# Patient Record
Sex: Female | Born: 1960 | Race: White | Hispanic: No | Smoking: Former smoker
Health system: Southern US, Community
[De-identification: ages and names within clinical notes are randomized; demographics above are authoritative.]

## PROBLEM LIST (undated history)

## (undated) DIAGNOSIS — L409 Psoriasis, unspecified: Secondary | ICD-10-CM

## (undated) DIAGNOSIS — G5601 Carpal tunnel syndrome, right upper limb: Secondary | ICD-10-CM

## (undated) DIAGNOSIS — F419 Anxiety disorder, unspecified: Secondary | ICD-10-CM

## (undated) DIAGNOSIS — M1990 Unspecified osteoarthritis, unspecified site: Secondary | ICD-10-CM

## (undated) HISTORY — PX: BREAST CYST ASPIRATION: SHX578

## (undated) HISTORY — DX: Psoriasis, unspecified: L40.9

## (undated) HISTORY — PX: TUBAL LIGATION: SHX77

## (undated) HISTORY — DX: Anxiety disorder, unspecified: F41.9

---

## 2005-07-14 ENCOUNTER — Ambulatory Visit: Payer: Self-pay

## 2006-08-02 ENCOUNTER — Ambulatory Visit: Payer: Self-pay

## 2007-08-15 ENCOUNTER — Ambulatory Visit: Payer: Self-pay

## 2008-08-16 ENCOUNTER — Ambulatory Visit: Payer: Self-pay

## 2009-08-20 ENCOUNTER — Ambulatory Visit: Payer: Self-pay

## 2010-06-05 HISTORY — PX: COLONOSCOPY: SHX174

## 2010-06-18 ENCOUNTER — Ambulatory Visit: Payer: Self-pay | Admitting: Gastroenterology

## 2010-08-26 ENCOUNTER — Ambulatory Visit: Payer: Self-pay

## 2011-09-01 ENCOUNTER — Ambulatory Visit: Payer: Self-pay

## 2016-05-31 ENCOUNTER — Other Ambulatory Visit: Payer: Self-pay | Admitting: Obstetrics and Gynecology

## 2016-05-31 DIAGNOSIS — Z1231 Encounter for screening mammogram for malignant neoplasm of breast: Secondary | ICD-10-CM

## 2016-07-12 DIAGNOSIS — Z01419 Encounter for gynecological examination (general) (routine) without abnormal findings: Secondary | ICD-10-CM | POA: Diagnosis not present

## 2016-07-23 ENCOUNTER — Other Ambulatory Visit: Payer: Self-pay | Admitting: Obstetrics and Gynecology

## 2016-07-23 ENCOUNTER — Ambulatory Visit
Admission: RE | Admit: 2016-07-23 | Discharge: 2016-07-23 | Disposition: A | Discharge status: Home / Self Care | Payer: BLUE CROSS/BLUE SHIELD | Source: Ambulatory Visit | Attending: Obstetrics and Gynecology | Admitting: Obstetrics and Gynecology

## 2016-07-23 DIAGNOSIS — Z1231 Encounter for screening mammogram for malignant neoplasm of breast: Secondary | ICD-10-CM

## 2016-11-23 DIAGNOSIS — B309 Viral conjunctivitis, unspecified: Secondary | ICD-10-CM | POA: Diagnosis not present

## 2016-12-17 DIAGNOSIS — Z0100 Encounter for examination of eyes and vision without abnormal findings: Secondary | ICD-10-CM | POA: Diagnosis not present

## 2017-06-13 ENCOUNTER — Other Ambulatory Visit: Payer: Self-pay | Admitting: Obstetrics and Gynecology

## 2017-06-13 DIAGNOSIS — Z1231 Encounter for screening mammogram for malignant neoplasm of breast: Secondary | ICD-10-CM

## 2017-07-19 NOTE — Progress Notes (Signed)
Gynecology Annual Exam  PCP: Patient, No Pcp Per  Chief Complaint:  Chief Complaint  Patient presents with  . Gynecologic Exam    Refill on Xanax    History of Present Illness:Patient is a 56 y.o. No obstetric history on file. presents for annual exam. The patient has no complaints today.   LMP: No LMP recorded. Patient is postmenopausal. No bleeding or spotting  The patient is not sexually active. She denies dyspareunia.  The patient does perform self breast exams.  There is notable family history of breast or ovarian cancer in her family maternal grandmother.  The patient wears seatbelts: yes.   The patient has regular exercise: not asked.    The patient denies current symptoms of depression.     Review of Systems: Review of Systems  Constitutional: Negative for chills and fever.  HENT: Negative for congestion.   Respiratory: Negative for cough and shortness of breath.   Cardiovascular: Negative for chest pain and palpitations.  Gastrointestinal: Negative for abdominal pain, constipation, diarrhea, heartburn, nausea and vomiting.  Genitourinary: Negative for dysuria, frequency and urgency.  Skin: Negative for itching and rash.  Neurological: Negative for dizziness and headaches.  Endo/Heme/Allergies: Negative for polydipsia.  Psychiatric/Behavioral: Negative for depression.    Past Medical History:  History reviewed. No pertinent past medical history.  Past Surgical History:  Past Surgical History:  Procedure Laterality Date  . BREAST CYST ASPIRATION      Gynecologic History:  No LMP recorded. Patient is postmenopausal. Last Pap: Results were: 07/12/16 NIL and HR HPV negative  Last mammogram: 07/13/16 Results were: BI-RAD I   Obstetric History: No obstetric history on file.  Family History:  Family History  Problem Relation Age of Onset  . Breast cancer Maternal Grandmother     Social History:  Social History   Social History  . Marital status:  Married    Spouse name: N/A  . Number of children: N/A  . Years of education: N/A   Occupational History  . Not on file.   Social History Main Topics  . Smoking status: Light Tobacco Smoker  . Smokeless tobacco: Never Used  . Alcohol use Yes     Comment: Occ  . Drug use: No  . Sexual activity: No   Other Topics Concern  . Not on file   Social History Narrative  . No narrative on file    Allergies:  No Known Allergies  Medications: Prior to Admission medications   Not on File    Physical Exam Vitals: There were no vitals taken for this visit.  Physical Exam  Constitutional: She is oriented to person, place, and time. She appears well-developed and well-nourished. No distress.  Genitourinary: Vagina normal and uterus normal. Pelvic exam was performed with patient supine. There is no rash, lesion or Bartholin's cyst on the right labia. There is no rash, lesion or Bartholin's cyst on the left labia. Vagina exhibits no lesion. No bleeding in the vagina. No vaginal discharge found. Right adnexum does not display mass and does not display tenderness. Left adnexum does not display mass and does not display tenderness. Cervix does not exhibit motion tenderness, lesion, discharge, friability or polyp.   Uterus is anteverted. Uterus is not enlarged, tender, exhibiting a mass, irregular (is regular) or mobile.  HENT:  Head: Normocephalic and atraumatic.  Right Ear: External ear normal.  Left Ear: External ear normal.  Nose: Nose normal.  Eyes: Conjunctivae are normal. No scleral icterus.  Neck:  Neck supple. No thyromegaly present.  Cardiovascular: Normal rate, regular rhythm, normal heart sounds and intact distal pulses.   Pulmonary/Chest: Effort normal and breath sounds normal.  Abdominal: Soft. Bowel sounds are normal. She exhibits no distension and no mass. There is no tenderness. No hernia.  Musculoskeletal: Normal range of motion.  Lymphadenopathy:       Right: No inguinal  adenopathy present.       Left: No inguinal adenopathy present.  Neurological: She is alert and oriented to person, place, and time. No cranial nerve deficit.  Skin: Skin is warm. No rash noted.  Psychiatric: She has a normal mood and affect. Her behavior is normal. Judgment and thought content normal.  Vitals reviewed.   Female chaperone present for pelvic and breast  portions of the physical exam     Assessment: 56 y.o. . routine annual exam  Plan: Problem List Items Addressed This Visit    None    Visit Diagnoses    Screening for malignant neoplasm of cervix       Relevant Orders   PapIG, HPV, rfx 16/18   Breast screening       Relevant Orders   MM DIGITAL SCREENING BILATERAL   Encounter for gynecological examination without abnormal finding       Relevant Orders   PapIG, HPV, rfx 16/18      1) Mammogram - recommend yearly screening mammogram.  Mammogram Was ordered today  2) STI screening was not offered  3) ASCCP guidelines and rational discussed.  Patient opts for yearly screening interval  4) Osteoporosis  - per USPTF routine screening DEXA at age 765  5) Routine healthcare maintenance including cholesterol, diabetes screening discussed managed by PCP - obtains yearly health screening at work  6) Colonoscopy - Screening recommended starting at age 57 for average risk individuals, age 57 for individuals deemed at increased risk (including African Americans) and recommended to continue until age 575.  For patient age 57-85 individualized approach is recommended.  Gold standard screening is via colonoscopy, Cologuard screening is an acceptable alternative for patient unwilling or unable to undergo colonoscopy.  "Colorectal cancer screening for average?risk adults: 2018 guideline update from the American Cancer Society"CA: A Cancer Journal for Clinicians: May 04, 2017  - up to date next age 57  7) Follow up 1 year for routine annual

## 2017-07-20 ENCOUNTER — Encounter: Payer: Self-pay | Admitting: Obstetrics and Gynecology

## 2017-07-20 ENCOUNTER — Ambulatory Visit (INDEPENDENT_AMBULATORY_CARE_PROVIDER_SITE_OTHER): Payer: BLUE CROSS/BLUE SHIELD | Admitting: Obstetrics and Gynecology

## 2017-07-20 ENCOUNTER — Telehealth: Payer: Self-pay

## 2017-07-20 DIAGNOSIS — Z1239 Encounter for other screening for malignant neoplasm of breast: Secondary | ICD-10-CM

## 2017-07-20 DIAGNOSIS — Z1231 Encounter for screening mammogram for malignant neoplasm of breast: Secondary | ICD-10-CM

## 2017-07-20 DIAGNOSIS — Z124 Encounter for screening for malignant neoplasm of cervix: Secondary | ICD-10-CM

## 2017-07-20 DIAGNOSIS — Z01419 Encounter for gynecological examination (general) (routine) without abnormal findings: Secondary | ICD-10-CM

## 2017-07-20 NOTE — Telephone Encounter (Signed)
Please advise 

## 2017-07-20 NOTE — Patient Instructions (Signed)
Preventive Care 40-64 Years, Female Preventive care refers to lifestyle choices and visits with your health care provider that can promote health and wellness. What does preventive care include?  A yearly physical exam. This is also called an annual well check.  Dental exams once or twice a year.  Routine eye exams. Ask your health care provider how often you should have your eyes checked.  Personal lifestyle choices, including: ? Daily care of your teeth and gums. ? Regular physical activity. ? Eating a healthy diet. ? Avoiding tobacco and drug use. ? Limiting alcohol use. ? Practicing safe sex. ? Taking low-dose aspirin daily starting at age 58. ? Taking vitamin and mineral supplements as recommended by your health care provider. What happens during an annual well check? The services and screenings done by your health care provider during your annual well check will depend on your age, overall health, lifestyle risk factors, and family history of disease. Counseling Your health care provider may ask you questions about your:  Alcohol use.  Tobacco use.  Drug use.  Emotional well-being.  Home and relationship well-being.  Sexual activity.  Eating habits.  Work and work Statistician.  Method of birth control.  Menstrual cycle.  Pregnancy history.  Screening You may have the following tests or measurements:  Height, weight, and BMI.  Blood pressure.  Lipid and cholesterol levels. These may be checked every 5 years, or more frequently if you are over 81 years old.  Skin check.  Lung cancer screening. You may have this screening every year starting at age 78 if you have a 30-pack-year history of smoking and currently smoke or have quit within the past 15 years.  Fecal occult blood test (FOBT) of the stool. You may have this test every year starting at age 65.  Flexible sigmoidoscopy or colonoscopy. You may have a sigmoidoscopy every 5 years or a colonoscopy  every 10 years starting at age 30.  Hepatitis C blood test.  Hepatitis B blood test.  Sexually transmitted disease (STD) testing.  Diabetes screening. This is done by checking your blood sugar (glucose) after you have not eaten for a while (fasting). You may have this done every 1-3 years.  Mammogram. This may be done every 1-2 years. Talk to your health care provider about when you should start having regular mammograms. This may depend on whether you have a family history of breast cancer.  BRCA-related cancer screening. This may be done if you have a family history of breast, ovarian, tubal, or peritoneal cancers.  Pelvic exam and Pap test. This may be done every 3 years starting at age 80. Starting at age 36, this may be done every 5 years if you have a Pap test in combination with an HPV test.  Bone density scan. This is done to screen for osteoporosis. You may have this scan if you are at high risk for osteoporosis.  Discuss your test results, treatment options, and if necessary, the need for more tests with your health care provider. Vaccines Your health care provider may recommend certain vaccines, such as:  Influenza vaccine. This is recommended every year.  Tetanus, diphtheria, and acellular pertussis (Tdap, Td) vaccine. You may need a Td booster every 10 years.  Varicella vaccine. You may need this if you have not been vaccinated.  Zoster vaccine. You may need this after age 5.  Measles, mumps, and rubella (MMR) vaccine. You may need at least one dose of MMR if you were born in  1958 or later. You may also need a second dose.  Pneumococcal 13-valent conjugate (PCV13) vaccine. You may need this if you have certain conditions and were not previously vaccinated.  Pneumococcal polysaccharide (PPSV23) vaccine. You may need one or two doses if you smoke cigarettes or if you have certain conditions.  Meningococcal vaccine. You may need this if you have certain  conditions.  Hepatitis A vaccine. You may need this if you have certain conditions or if you travel or work in places where you may be exposed to hepatitis A.  Hepatitis B vaccine. You may need this if you have certain conditions or if you travel or work in places where you may be exposed to hepatitis B.  Haemophilus influenzae type b (Hib) vaccine. You may need this if you have certain conditions.  Talk to your health care provider about which screenings and vaccines you need and how often you need them. This information is not intended to replace advice given to you by your health care provider. Make sure you discuss any questions you have with your health care provider. Document Released: 12/19/2015 Document Revised: 08/11/2016 Document Reviewed: 09/23/2015 Elsevier Interactive Patient Education  2017 [employer].

## 2017-07-20 NOTE — Telephone Encounter (Signed)
Pt was seen this am by AMS, forgot to ask if he will call in [facility] [address] [facility] [place].  Please let her know.  [phone]

## 2017-07-22 LAB — PAPIG, HPV, RFX 16/18
HPV, HIGH-RISK: NEGATIVE
PAP SMEAR COMMENT: 0

## 2017-07-25 ENCOUNTER — Other Ambulatory Visit: Payer: Self-pay | Admitting: Obstetrics and Gynecology

## 2017-07-25 ENCOUNTER — Encounter: Payer: Self-pay | Admitting: Obstetrics and Gynecology

## 2017-07-25 MED ORDER — ALPRAZOLAM 0.25 MG PO TABS: 0.2500 mg | ORAL_TABLET | Freq: Two times a day (BID) | ORAL | 0 refills | Status: DC | PRN

## 2017-07-27 ENCOUNTER — Ambulatory Visit
Admission: RE | Admit: 2017-07-27 | Discharge: 2017-07-27 | Disposition: A | Discharge status: Home / Self Care | Payer: BLUE CROSS/BLUE SHIELD | Source: Ambulatory Visit | Attending: Obstetrics and Gynecology | Admitting: Obstetrics and Gynecology

## 2017-07-27 DIAGNOSIS — Z1231 Encounter for screening mammogram for malignant neoplasm of breast: Secondary | ICD-10-CM | POA: Diagnosis not present

## 2017-07-29 ENCOUNTER — Encounter: Payer: Self-pay | Admitting: Obstetrics and Gynecology

## 2017-09-21 DIAGNOSIS — H01119 Allergic dermatitis of unspecified eye, unspecified eyelid: Secondary | ICD-10-CM | POA: Diagnosis not present

## 2018-01-03 ENCOUNTER — Telehealth: Payer: Self-pay

## 2018-01-03 ENCOUNTER — Other Ambulatory Visit: Payer: Self-pay | Admitting: Obstetrics and Gynecology

## 2018-01-03 NOTE — Telephone Encounter (Signed)
I received and denied request from pharmacy this morning d/t pt needs a medication follow up appointment with AMS. She has not been seen since 07/2017.    Please schedule AMS or first available. Does not have to be overbooked

## 2018-01-03 NOTE — Telephone Encounter (Signed)
Pt called to request refill for alprazolam 0.25mg . She would like a 90 days supply. Advised pt that AMS out of the office today and not sure if we could do a 90 day or not but would send msg to his nurse. Pt cb# [phone]. Thank you.

## 2018-01-05 NOTE — Telephone Encounter (Signed)
Left message for pt to cb and schedule med follow up for any refills.

## 2018-01-09 ENCOUNTER — Other Ambulatory Visit: Payer: Self-pay | Admitting: Obstetrics and Gynecology

## 2018-01-09 MED ORDER — ALPRAZOLAM 0.25 MG PO TABS: 0.2500 mg | ORAL_TABLET | Freq: Two times a day (BID) | ORAL | 0 refills | Status: DC | PRN

## 2018-01-09 NOTE — Telephone Encounter (Signed)
Called and spoke with patient about scheduling medication follow up. Pt states Dr. Bonney AidStaebler prescribes medication at yearly annual and needs prescription for 90 days refills. Please advise

## 2018-01-09 NOTE — Telephone Encounter (Signed)
Pt is notified.

## 2018-01-09 NOTE — Telephone Encounter (Signed)
Pt calling again stating according to MyChart refill of alprazolam has been denied.  Please call. [phone]

## 2018-01-09 NOTE — Telephone Encounter (Signed)
Refill sent.

## 2018-07-28 ENCOUNTER — Ambulatory Visit
Admission: RE | Admit: 2018-07-28 | Discharge: 2018-07-28 | Disposition: A | Discharge status: Home / Self Care | Payer: BLUE CROSS/BLUE SHIELD | Source: Ambulatory Visit | Attending: Obstetrics and Gynecology | Admitting: Obstetrics and Gynecology

## 2018-07-28 DIAGNOSIS — Z1231 Encounter for screening mammogram for malignant neoplasm of breast: Secondary | ICD-10-CM | POA: Diagnosis not present

## 2018-07-28 DIAGNOSIS — Z1239 Encounter for other screening for malignant neoplasm of breast: Secondary | ICD-10-CM

## 2018-09-12 ENCOUNTER — Ambulatory Visit
Admission: RE | Admit: 2018-09-12 | Discharge: 2018-09-12 | Disposition: A | Discharge status: Home / Self Care | Payer: BLUE CROSS/BLUE SHIELD | Source: Ambulatory Visit | Attending: Registered Nurse | Admitting: Registered Nurse

## 2018-09-12 ENCOUNTER — Other Ambulatory Visit: Payer: Self-pay | Admitting: Registered Nurse

## 2018-09-12 DIAGNOSIS — R2232 Localized swelling, mass and lump, left upper limb: Secondary | ICD-10-CM | POA: Insufficient documentation

## 2018-09-12 DIAGNOSIS — M25532 Pain in left wrist: Secondary | ICD-10-CM | POA: Diagnosis not present

## 2018-09-12 DIAGNOSIS — S6992XA Unspecified injury of left wrist, hand and finger(s), initial encounter: Secondary | ICD-10-CM | POA: Diagnosis not present

## 2018-09-12 DIAGNOSIS — R609 Edema, unspecified: Secondary | ICD-10-CM

## 2018-09-12 DIAGNOSIS — M7989 Other specified soft tissue disorders: Secondary | ICD-10-CM | POA: Insufficient documentation

## 2018-09-12 DIAGNOSIS — M79645 Pain in left finger(s): Secondary | ICD-10-CM | POA: Diagnosis not present

## 2018-09-12 DIAGNOSIS — R52 Pain, unspecified: Secondary | ICD-10-CM

## 2018-10-23 DIAGNOSIS — M65322 Trigger finger, left index finger: Secondary | ICD-10-CM | POA: Diagnosis not present

## 2018-10-25 DIAGNOSIS — H02889 Meibomian gland dysfunction of unspecified eye, unspecified eyelid: Secondary | ICD-10-CM | POA: Diagnosis not present

## 2018-10-25 DIAGNOSIS — H5213 Myopia, bilateral: Secondary | ICD-10-CM | POA: Diagnosis not present

## 2018-10-27 DIAGNOSIS — M65322 Trigger finger, left index finger: Secondary | ICD-10-CM | POA: Diagnosis not present

## 2019-02-15 DIAGNOSIS — D2262 Melanocytic nevi of left upper limb, including shoulder: Secondary | ICD-10-CM | POA: Diagnosis not present

## 2019-02-15 DIAGNOSIS — D2261 Melanocytic nevi of right upper limb, including shoulder: Secondary | ICD-10-CM | POA: Diagnosis not present

## 2019-02-15 DIAGNOSIS — D225 Melanocytic nevi of trunk: Secondary | ICD-10-CM | POA: Diagnosis not present

## 2019-02-15 DIAGNOSIS — L400 Psoriasis vulgaris: Secondary | ICD-10-CM | POA: Diagnosis not present

## 2019-05-31 ENCOUNTER — Telehealth: Payer: Self-pay

## 2019-05-31 NOTE — Telephone Encounter (Signed)
Jacquelin contacted me to let me know that she will contact Dr. Georgianne Fick to order her mammogram.  States she hasn't done her annual physical with our office because of COVID & she's high risk and doesn't wish to come to the office if she doesn't have to.

## 2019-05-31 NOTE — Telephone Encounter (Signed)
Virginia Galvan sent an email stating she called [facility] to schedule her annual mammogram & they told her she had to call her doctor to get them to send an order for it.   Reviewed her chart & saw that GYN physician has ordered them & we've never ordered them for her.  Spoke with Dr. Roxan Hockey who said it would be best for the GYN doc to order her mammogram for continuity of care. [name] to contact her GYN to order the mammogram, but if she's no longer seeing a GYN to contact us to discuss.

## 2019-06-11 ENCOUNTER — Other Ambulatory Visit: Payer: Self-pay

## 2019-06-12 MED ORDER — ALPRAZOLAM 0.25 MG PO TABS: ORAL_TABLET | ORAL | 0 refills | Status: DC

## 2019-06-28 ENCOUNTER — Telehealth: Payer: Self-pay

## 2019-06-28 NOTE — Telephone Encounter (Signed)
Pt calling for AMS to put order in for her annual mammogram at the [facility].  [phone]

## 2019-06-29 NOTE — Telephone Encounter (Signed)
OK to wait until you return to office. KJ CMA

## 2019-06-30 ENCOUNTER — Other Ambulatory Visit: Payer: Self-pay | Admitting: Obstetrics and Gynecology

## 2019-06-30 DIAGNOSIS — Z1231 Encounter for screening mammogram for malignant neoplasm of breast: Secondary | ICD-10-CM

## 2019-06-30 NOTE — Telephone Encounter (Signed)
Order is in.

## 2019-07-02 NOTE — Telephone Encounter (Signed)
Pt aware via voicemail 

## 2019-07-25 ENCOUNTER — Ambulatory Visit: Payer: 59

## 2019-07-26 ENCOUNTER — Other Ambulatory Visit: Payer: Self-pay

## 2019-07-26 ENCOUNTER — Ambulatory Visit: Payer: 59

## 2019-07-26 DIAGNOSIS — Z23 Encounter for immunization: Secondary | ICD-10-CM

## 2019-08-06 ENCOUNTER — Ambulatory Visit
Admission: RE | Admit: 2019-08-06 | Discharge: 2019-08-06 | Disposition: A | Discharge status: Home / Self Care | Payer: 59 | Source: Ambulatory Visit | Attending: Obstetrics and Gynecology | Admitting: Obstetrics and Gynecology

## 2019-08-06 DIAGNOSIS — Z1231 Encounter for screening mammogram for malignant neoplasm of breast: Secondary | ICD-10-CM | POA: Insufficient documentation

## 2019-08-07 ENCOUNTER — Other Ambulatory Visit: Payer: Self-pay | Admitting: Internal Medicine

## 2019-08-08 ENCOUNTER — Other Ambulatory Visit: Payer: Self-pay

## 2019-08-08 ENCOUNTER — Ambulatory Visit: Payer: Self-pay | Admitting: Internal Medicine

## 2019-08-08 ENCOUNTER — Encounter: Payer: Self-pay | Admitting: Internal Medicine

## 2019-08-08 VITALS — BP 108/63 | HR 75 | Temp 98.1°F | Resp 14 | Ht 67.0 in | Wt 157.0 lb

## 2019-08-08 DIAGNOSIS — L300 Nummular dermatitis: Secondary | ICD-10-CM | POA: Insufficient documentation

## 2019-08-08 DIAGNOSIS — F411 Generalized anxiety disorder: Secondary | ICD-10-CM

## 2019-08-08 DIAGNOSIS — F17200 Nicotine dependence, unspecified, uncomplicated: Secondary | ICD-10-CM | POA: Insufficient documentation

## 2019-08-08 DIAGNOSIS — L409 Psoriasis, unspecified: Secondary | ICD-10-CM

## 2019-08-08 MED ORDER — CALCIPOTRIENE-BETAMETH DIPROP 0.005-0.064 % EX OINT: 1.0000 "application " | TOPICAL_OINTMENT | Freq: Every day | CUTANEOUS | 1 refills | Status: DC

## 2019-08-08 MED ORDER — TRIAMCINOLONE ACETONIDE 0.1 % EX CREA
1.0000 | TOPICAL_CREAM | Freq: Two times a day (BID) | CUTANEOUS | 1 refills | Status: DC
Start: 2019-08-08 — End: 2023-07-02

## 2019-08-08 MED ORDER — ALPRAZOLAM 0.25 MG PO TABS: ORAL_TABLET | ORAL | 2 refills | Status: DC

## 2019-08-08 MED ORDER — FLUOCINOLONE ACETONIDE 0.01 % OT OIL: 5.0000 [drp] | TOPICAL_OIL | OTIC | 1 refills | Status: DC | PRN

## 2019-08-08 NOTE — Progress Notes (Signed)
S -patient is a 58 year old white female who comes requesting refills of medications.  She is on multiple topical ointments and creams for psoriasis.  1 is a drop she uses for itching in her ears.  1 was started for a rash underneath her armpit area on both sides more recently, the [facility] product.  She has had psoriasis for years, at one point did see a dermatologist in [place].  She has it now in her elbow region bilaterally, has involved her nails to some extent, and also has patches on her lower extremities.  She uses the ointment products for her psoriasis intermittently, the one not more than 4 weeks at a time as recommended.  She also requests a refill of her Xanax which she uses for anxiety.  ("Nerves").  She has been taking this medicine for 10 years now, started initially when she lost her job, and really was not using it after that time when she got another job here with a steady.  She then returned to using it daily in the more recent past, as her husband is in need of almost total care, and creates a lot of anxiety for her.  She notes that she does not take it on the weekends.  She only takes it once a day.  No Known Allergies Current Outpatient Medications on File Prior to Visit  Medication Sig Dispense Refill  . Cholecalciferol (VITAMIN D3 PO) Take by mouth.    . clobetasol ointment (TEMOVATE) 0.05 % Apply 1 application topically every morning.    . Multiple Vitamin (MULTI-VITAMIN) tablet Take by mouth.    . vitamin B-12 (CYANOCOBALAMIN) 1000 MCG tablet Take by mouth.     No current facility-administered medications on file prior to visit.    The medications she is taking also includes a TMC cream-0.1%, which she applies to the areas underneath her armpits, a fluocinolone acetonide 0.01% oil which she puts on a Q-tip and uses in her ear canals when they feel itchy, which is usually once in the morning, and then does not need for days later.  She states she does not even use this more than  once a month, a Taclonex ointment which she applies 1 application daily, and does not use this more than several weeks at a time as recommended.  She takes this in the morning, and then has the clobetasol ointment that she applies in the evening when she needs this for her psoriasis.  Tob -she does smoke cigarettes, mostly when she is having alcohol on the weekends only, denies any heavy alcohol use.  O - NAD, maskd BP 108/63 (BP Location: Right Arm, Patient Position: Sitting, Cuff Size: Large)   Pulse 75   Temp 98.1 F (36.7 C) (Oral)   Resp 14   Ht 5\' 7"  (1.702 m)   Wt 157 lb (71.2 kg)   SpO2 98%   BMI 24.59 kg/m   HEENT -sclera anicteric, no facial involvement. Skin -scattered more circular patches were present on her lower extremities, with a small cluster lateral lower extremity above the left ankle.  There was no frank plaque entities on the lower extremities.  These patches were erythematous and slightly raised and scaly.  Her elbows bilaterally on the extensor surfaces had remnants of more plaque-like psoriasis, with some erythema and scaliness present.  Beneath her axilla regions bilaterally were fading erythematous areas, no marked scaliness evident on exam today.  Several nails on the right hand had mild changes consistent with inflammation underneath  the distal nail  Her affect was not flat, she was very appropriate with conversation, speech was not rapid.  Ass - 1.  Psoriasis -a history of this noted in the paper chart which was reviewed today.  Has been managed with several topical entities, and concerned with the number of more concentrated steroids that have been utilized Refilled the Taclonex ointment to use to the none nummular eczema and psoriasis concerns.  To apply sparingly, and not more than 4 weeks. Did not refill the clobetasol product as that is a very concentrated steroid noted and discussed the risks and concerns with that with long-term use.  If felt needed,  would ask the dermatologist to help with that need. Do feel best to get a dermatology opinion again to try to help curtail her regimen some, possibly consider other entities pending her severity that may not involve topical steroids, and a referral was written today and await their input.  She prefers to see the dermatologist she has seen in the past and I think that is reasonable.  2. Nummular eczema   - LE's  I do believe the areas on the lower extremity are consistent with nummular eczema, and discussed this with her today.  Okay to use the Taclonex ointment product to this area as well on the lower extremities as await Derm input.  Noted the eczema to psoriasis spectrum in the education today, as well as a lot of the newer entities out there to potentially treat psoriasis.  3. Anxiety  I did refill the Xanax-0.25 mg to use daily as needed, and discussed at length concerns with this medicine used on any regular basis.  It has been quite helpful for her with a lot of the stressors at home presently, caring for her husband, and will continue to use daily as needed, continuing to have weekends off as she is doing, and if she can have days away from it even during the week, encouraged that as well.  $. Tobacco use - rec'ed complete cessation as best  Await dermatology's input presently

## 2020-01-07 DIAGNOSIS — D2261 Melanocytic nevi of right upper limb, including shoulder: Secondary | ICD-10-CM | POA: Diagnosis not present

## 2020-01-07 DIAGNOSIS — L308 Other specified dermatitis: Secondary | ICD-10-CM | POA: Diagnosis not present

## 2020-01-07 DIAGNOSIS — L309 Dermatitis, unspecified: Secondary | ICD-10-CM | POA: Diagnosis not present

## 2020-01-07 DIAGNOSIS — D2272 Melanocytic nevi of left lower limb, including hip: Secondary | ICD-10-CM | POA: Diagnosis not present

## 2020-01-07 DIAGNOSIS — L400 Psoriasis vulgaris: Secondary | ICD-10-CM | POA: Diagnosis not present

## 2020-01-07 DIAGNOSIS — D2262 Melanocytic nevi of left upper limb, including shoulder: Secondary | ICD-10-CM | POA: Diagnosis not present

## 2020-03-14 DIAGNOSIS — H5213 Myopia, bilateral: Secondary | ICD-10-CM | POA: Diagnosis not present

## 2020-04-03 NOTE — Progress Notes (Signed)
Scheduled to complete physical 04/09/20 with Bridget Hartshorn, PA-C.  AMD

## 2020-04-04 ENCOUNTER — Ambulatory Visit: Payer: Self-pay

## 2020-04-04 ENCOUNTER — Other Ambulatory Visit: Payer: Self-pay

## 2020-04-04 DIAGNOSIS — Z0000 Encounter for general adult medical examination without abnormal findings: Secondary | ICD-10-CM

## 2020-04-04 LAB — POCT URINALYSIS DIPSTICK
Bilirubin, UA: NEGATIVE
Blood, UA: NEGATIVE
Glucose, UA: NEGATIVE
Ketones, UA: NEGATIVE
Nitrite, UA: NEGATIVE
Protein, UA: NEGATIVE
Spec Grav, UA: 1.025 (ref 1.010–1.025)
Urobilinogen, UA: 0.2 E.U./dL
pH, UA: 5.5 (ref 5.0–8.0)

## 2020-04-05 LAB — CMP12+LP+TP+TSH+6AC+CBC/D/PLT
ALT: 22 IU/L (ref 0–32)
AST: 25 IU/L (ref 0–40)
Albumin/Globulin Ratio: 2.1 (ref 1.2–2.2)
Albumin: 4.6 g/dL (ref 3.8–4.9)
Alkaline Phosphatase: 96 IU/L (ref 39–117)
BUN/Creatinine Ratio: 18 (ref 9–23)
BUN: 17 mg/dL (ref 6–24)
Basophils Absolute: 0.1 10*3/uL (ref 0.0–0.2)
Basos: 1 %
Bilirubin Total: 0.3 mg/dL (ref 0.0–1.2)
Calcium: 9.8 mg/dL (ref 8.7–10.2)
Chloride: 106 mmol/L (ref 96–106)
Chol/HDL Ratio: 4.6 ratio — ABNORMAL HIGH (ref 0.0–4.4)
Cholesterol, Total: 210 mg/dL — ABNORMAL HIGH (ref 100–199)
Creatinine, Ser: 0.93 mg/dL (ref 0.57–1.00)
EOS (ABSOLUTE): 0.1 10*3/uL (ref 0.0–0.4)
Eos: 2 %
Estimated CHD Risk: 1.1 times avg. — ABNORMAL HIGH (ref 0.0–1.0)
Free Thyroxine Index: 1.7 (ref 1.2–4.9)
GFR calc Af Amer: 78 mL/min/{1.73_m2} (ref >59)
GFR calc non Af Amer: 67 mL/min/{1.73_m2} (ref >59)
GGT: 18 IU/L (ref 0–60)
Globulin, Total: 2.2 g/dL (ref 1.5–4.5)
Glucose: 92 mg/dL (ref 65–99)
HDL: 46 mg/dL (ref >39)
Hematocrit: 39.1 % (ref 34.0–46.6)
Hemoglobin: 13.3 g/dL (ref 11.1–15.9)
Immature Grans (Abs): 0 10*3/uL (ref 0.0–0.1)
Immature Granulocytes: 0 %
Iron: 88 ug/dL (ref 27–159)
LDH: 157 IU/L (ref 119–226)
LDL Chol Calc (NIH): 142 mg/dL — ABNORMAL HIGH (ref 0–99)
Lymphocytes Absolute: 1.3 10*3/uL (ref 0.7–3.1)
Lymphs: 24 %
MCH: 30.8 pg (ref 26.6–33.0)
MCHC: 34 g/dL (ref 31.5–35.7)
MCV: 91 fL (ref 79–97)
Monocytes Absolute: 0.6 10*3/uL (ref 0.1–0.9)
Monocytes: 11 %
Neutrophils Absolute: 3.3 10*3/uL (ref 1.4–7.0)
Neutrophils: 62 %
Phosphorus: 3.7 mg/dL (ref 3.0–4.3)
Platelets: 280 10*3/uL (ref 150–450)
Potassium: 4.4 mmol/L (ref 3.5–5.2)
RBC: 4.32 x10E6/uL (ref 3.77–5.28)
RDW: 12.1 % (ref 11.7–15.4)
Sodium: 141 mmol/L (ref 134–144)
T3 Uptake Ratio: 27 % (ref 24–39)
T4, Total: 6.2 ug/dL (ref 4.5–12.0)
TSH: 2.24 u[IU]/mL (ref 0.450–4.500)
Total Protein: 6.8 g/dL (ref 6.0–8.5)
Triglycerides: 120 mg/dL (ref 0–149)
Uric Acid: 5.4 mg/dL (ref 3.0–7.2)
VLDL Cholesterol Cal: 22 mg/dL (ref 5–40)
WBC: 5.4 10*3/uL (ref 3.4–10.8)

## 2020-04-05 LAB — SEDIMENTATION RATE: Sed Rate: 20 mm/hr (ref 0–40)

## 2020-04-05 LAB — RHEUMATOID FACTOR: Rhuematoid fact SerPl-aCnc: <10 IU/mL (ref 0.0–13.9)

## 2020-04-09 ENCOUNTER — Ambulatory Visit: Payer: 59 | Admitting: Emergency Medicine

## 2020-04-09 ENCOUNTER — Encounter: Payer: Self-pay | Admitting: Emergency Medicine

## 2020-04-09 ENCOUNTER — Other Ambulatory Visit: Payer: Self-pay

## 2020-04-09 VITALS — BP 105/62 | HR 62 | Temp 97.0°F | Resp 12 | Ht 67.0 in | Wt 158.0 lb

## 2020-04-09 DIAGNOSIS — L4050 Arthropathic psoriasis, unspecified: Secondary | ICD-10-CM

## 2020-04-09 DIAGNOSIS — L409 Psoriasis, unspecified: Secondary | ICD-10-CM

## 2020-04-09 DIAGNOSIS — Z0000 Encounter for general adult medical examination without abnormal findings: Secondary | ICD-10-CM

## 2020-04-09 NOTE — Progress Notes (Signed)
I have reviewed the triage vital signs and the nursing notes.   HISTORY  Chief Complaint Annual Exam   HPI Virginia Galvan is a 59 y.o. female is here for an annual physical.  Patient is also concerned about stiffness in certain joints and the fact that she has psoriasis.  She is also seeing changes in her nails that are concerning.  She states that when waking in the mornings that she is stiff.  Psoriatic arthritis is her concern.  Currently she is taking ibuprofen with some relief of her stiffness.      Past Medical History:  Diagnosis Date  . Anxiety   . Psoriasis     Patient Active Problem List   Diagnosis Date Noted  . Anxiety state 08/08/2019  . Psoriasis 08/08/2019  . Nummular eczema 08/08/2019  . Tobacco use disorder 08/08/2019    Past Surgical History:  Procedure Laterality Date  . BREAST CYST ASPIRATION Left 90s  . COLONOSCOPY  06/2010  . TUBAL LIGATION      Prior to Admission medications   Medication Sig Start Date End Date Taking? Authorizing Provider  ALPRAZolam Prudy Feeler) 0.25 MG tablet Take 1 tablet by mouth up to once daily as needed, Not take daily. 08/08/19  Yes Jamelle Haring, MD  calcipotriene-betamethasone (TACLONEX) ointment Apply 1 application topically daily. Not use on face, armpit, groin. Not use longer than 4 weeks. 08/08/19  Yes Jamelle Haring, MD  Cholecalciferol (VITAMIN D3 PO) Take by mouth.   Yes [provider]  clobetasol ointment (TEMOVATE) 0.05 % Apply 1 application topically every morning.   Yes [provider]  Fluocinolone Acetonide 0.01 % OIL Place 5 drops in ear(s) as needed. Use no more than 5 days 08/08/19  Yes Jamelle Haring, MD  Multiple Vitamin (MULTI-VITAMIN) tablet Take by mouth.   Yes [provider]  triamcinolone cream (KENALOG) 0.1 % Apply 1 application topically 2 (two) times daily. Use sparingly for areas under the armpit region 08/08/19  Yes Jamelle Haring, MD   vitamin B-12 (CYANOCOBALAMIN) 1000 MCG tablet Take by mouth.   Yes [provider]    Allergies Patient has no known allergies.  Family History  Problem Relation Age of Onset  . Breast cancer Maternal Grandmother   . Asthma Mother   . COPD Mother   . Kidney disease Mother   . Heart attack Father     Social History Social History   Tobacco Use  . Smoking status: Light Tobacco Smoker  . Smokeless tobacco: Never Used  Substance Use Topics  . Alcohol use: Yes    Comment: Occ  . Drug use: No    Review of Systems Constitutional: No fever/chills Eyes: No visual changes. ENT: No sore throat. Cardiovascular: Denies chest pain. Respiratory: Denies shortness of breath. Gastrointestinal: No abdominal pain.  No nausea, no vomiting.  No diarrhea.  No constipation. Genitourinary: Negative for dysuria. Musculoskeletal: Positive for joint stiffness. Skin: Positive for psoriasis.  Positive for nail changes. Neurological: Negative for headaches, focal weakness or numbness. ____________________________________________   PHYSICAL EXAM:  Constitutional: Alert and oriented. Well appearing and in no acute distress. Eyes: Conjunctivae are normal. PERRL. EOMI. Head: Atraumatic. Nose: No congestion/rhinnorhea. Mouth/Throat: Mucous membranes are moist.  Oropharynx non-erythematous. Neck: No stridor.  Cardiovascular: Normal rate, regular rhythm. Grossly normal heart sounds.  Good peripheral circulation. Respiratory: Normal respiratory effort.  No retractions. Lungs CTAB. Gastrointestinal: Soft and nontender. No distention.  Bowel sounds x4 quadrants is within  normal limits. Musculoskeletal: No gross deformity is noted on examination of the digits bilateral hands or feet.  There is some tenderness noted to the right fifth finger and great toe.  No joint swelling is noted to the knees, ankles, wrist or elbows. Neurologic:  Normal speech and language. No gross focal neurologic  deficits are appreciated. No gait instability. Skin:  Skin is warm, dry and intact.  Mild small patches of psoriasis is noted with the greatest area on the left lower extremity.  Nails have ridges and occasional scooped appearance to some.  Questionable early fungal involvement to the great toes. Psychiatric: Mood and affect are normal. Speech and behavior are normal.  ____________________________________________   LABS (all labs ordered are listed, but only abnormal results are displayed)  Labs were reviewed with patient. ____________________________________________  EKG Sinus rhythm with a ventricular rate of 63.    FINAL CLINICAL IMPRESSION(S)  Well physical female exam Psoriasis Questionable psoriatic arthritis   ED Discharge Orders         Ordered    EKG 12-Lead     04/09/20 0852    Ambulatory referral to Rheumatology     04/09/20 0951           Note:  This document was prepared using Dragon voice recognition software and may include unintentional dictation errors.

## 2020-04-09 NOTE — Progress Notes (Signed)
States gets all the ointments & creams from the dermatologist.

## 2020-04-15 ENCOUNTER — Other Ambulatory Visit: Payer: Self-pay

## 2020-04-15 MED ORDER — ALPRAZOLAM 0.25 MG PO TABS: ORAL_TABLET | ORAL | 0 refills | Status: DC

## 2020-05-07 DIAGNOSIS — M25572 Pain in left ankle and joints of left foot: Secondary | ICD-10-CM | POA: Insufficient documentation

## 2020-05-07 DIAGNOSIS — L409 Psoriasis, unspecified: Secondary | ICD-10-CM | POA: Diagnosis not present

## 2020-05-07 DIAGNOSIS — M151 Heberden's nodes (with arthropathy): Secondary | ICD-10-CM | POA: Insufficient documentation

## 2020-05-08 ENCOUNTER — Telehealth: Payer: Self-pay

## 2020-05-08 NOTE — Telephone Encounter (Signed)
Pt left msg on triage line asking if AMS can put in order for mammogram at [facility] [place]. Pt hasn't schedule annual due to her husbands illness and with Covid. Please advise.

## 2020-05-08 NOTE — Telephone Encounter (Signed)
I spoke to pt, She is aware her annual was last done in 2018. Her husband currently has COVID and is very sick. She prefers not to come into office. Advised at least get on the schedule for phone visit to discuss any concerns and update meds/information. She agrees to that if ok with AMS. Mammogram isn't due until 08/2020

## 2020-05-08 NOTE — Telephone Encounter (Signed)
Phone visit is fine.

## 2020-05-09 NOTE — Telephone Encounter (Signed)
Patient is scheduled for a phone visit on 05/28/2020 @ 8:10am

## 2020-05-09 NOTE — Telephone Encounter (Signed)
Please schedule a phone visit with AMS

## 2020-05-28 ENCOUNTER — Ambulatory Visit (INDEPENDENT_AMBULATORY_CARE_PROVIDER_SITE_OTHER): Payer: 59 | Admitting: Obstetrics and Gynecology

## 2020-05-28 ENCOUNTER — Other Ambulatory Visit: Payer: Self-pay

## 2020-05-28 DIAGNOSIS — Z1211 Encounter for screening for malignant neoplasm of colon: Secondary | ICD-10-CM

## 2020-05-28 DIAGNOSIS — Z01419 Encounter for gynecological examination (general) (routine) without abnormal findings: Secondary | ICD-10-CM | POA: Diagnosis not present

## 2020-05-28 DIAGNOSIS — Z1239 Encounter for other screening for malignant neoplasm of breast: Secondary | ICD-10-CM | POA: Diagnosis not present

## 2020-05-28 NOTE — Progress Notes (Signed)
I connected with Lesia Hausen on 05/28/20 at  8:10 AM EDT by telephone and verified that I am speaking with the correct person using two identifiers.   I discussed the limitations, risks, security and privacy concerns of performing an evaluation and management service by telephone and the availability of in person appointments. I also discussed with the patient that there may be a patient responsible charge related to this service. The patient expressed understanding and agreed to proceed.  The patient was at home I spoke with the patient from my workstation phone The names of people involved in this encounter were: Lesia Hausen , and Vena Austria   Gynecology Annual Exam  PCP: Patient, No Pcp Per  Chief Complaint: No chief complaint on file.   History of Present Illness:Patient is a 59 y.o. G0P0000 presents for annual exam. The patient has no complaints today.   LMP: No LMP recorded. Patient is postmenopausal. No vaginal bleeding.  The patient does perform self breast exams.  There is no notable family history of breast or ovarian cancer in her family.  The patient wears seatbelts: yes.   The patient has regular exercise: no.    The patient denies current symptoms of depression.     Review of Systems: Review of Systems  Constitutional: Negative.   Gastrointestinal: Negative.   Genitourinary: Negative.   Psychiatric/Behavioral: Negative.     Past Medical History:  Patient Active Problem List   Diagnosis Date Noted  . Anxiety state 08/08/2019  . Psoriasis 08/08/2019  . Nummular eczema 08/08/2019  . Tobacco use disorder 08/08/2019    Past Surgical History:  Past Surgical History:  Procedure Laterality Date  . BREAST CYST ASPIRATION Left 90s  . COLONOSCOPY  06/2010  . TUBAL LIGATION      Gynecologic History:  No LMP recorded. Patient is postmenopausal. Last Pap: Results were: 07/20/2017 NIL and HR HPV negative  Last mammogram: 08/06/2019 Results were:  BI-RAD I    Obstetric History: G0P0000  Family History:  Family History  Problem Relation Age of Onset  . Breast cancer Maternal Grandmother   . Asthma Mother   . COPD Mother   . Kidney disease Mother   . Heart attack Father     Social History:  Social History   Socioeconomic History  . Marital status: Married    Spouse name: Not on file  . Number of children: Not on file  . Years of education: Not on file  . Highest education level: Not on file  Occupational History  . Not on file  Tobacco Use  . Smoking status: Light Tobacco Smoker  . Smokeless tobacco: Never Used  Vaping Use  . Vaping Use: Never used  Substance and Sexual Activity  . Alcohol use: Yes    Comment: Occ  . Drug use: No  . Sexual activity: Never    Birth control/protection: Post-menopausal  Other Topics Concern  . Not on file  Social History Narrative  . Not on file   Social Determinants of Health   Financial Resource Strain:   . Difficulty of Paying Living Expenses:   Food Insecurity:   . Worried About Programme researcher, broadcasting/film/video in the Last Year:   . Barista in the Last Year:   Transportation Needs:   . Freight forwarder (Medical):   Marland Kitchen Lack of Transportation (Non-Medical):   Physical Activity:   . Days of Exercise per Week:   . Minutes of Exercise per Session:  Stress:   . Feeling of Stress :   Social Connections:   . Frequency of Communication with Friends and Family:   . Frequency of Social Gatherings with Friends and Family:   . Attends Religious Services:   . Active Member of Clubs or Organizations:   . Attends Archivist Meetings:   Marland Kitchen Marital Status:   Intimate Partner Violence:   . Fear of Current or Ex-Partner:   . Emotionally Abused:   Marland Kitchen Physically Abused:   . Sexually Abused:     Allergies:  No Known Allergies  Medications: Prior to Admission medications   Medication Sig Start Date End Date Taking? Authorizing Provider  ALPRAZolam Duanne Moron) 0.25 MG  tablet Take 1 tablet by mouth up to once daily as needed, Not take daily. 04/15/20   Sable Feil, PA-C  calcipotriene-betamethasone (TACLONEX) ointment Apply 1 application topically daily. Not use on face, armpit, groin. Not use longer than 4 weeks. 08/08/19   Towanda Malkin, MD  Cholecalciferol (VITAMIN D3 PO) Take by mouth.    [provider]  clobetasol ointment (TEMOVATE) 7.62 % Apply 1 application topically every morning.    [provider]  Fluocinolone Acetonide 0.01 % OIL Place 5 drops in ear(s) as needed. Use no more than 5 days 08/08/19   Towanda Malkin, MD  Multiple Vitamin (MULTI-VITAMIN) tablet Take by mouth.    [provider]  triamcinolone cream (KENALOG) 0.1 % Apply 1 application topically 2 (two) times daily. Use sparingly for areas under the armpit region 08/08/19   Towanda Malkin, MD  vitamin B-12 (CYANOCOBALAMIN) 1000 MCG tablet Take by mouth.    [provider]    Physical Exam  No physical exam as this was a remote telephone visit to promote social distancing during the current COVID-19 Pandemic   Assessment: 59 y.o. G0P0000 routine annual exam  Plan: Problem List Items Addressed This Visit    None    Visit Diagnoses    Encounter for gynecological examination without abnormal finding    -  Primary   Breast screening       Relevant Orders   MM 3D SCREEN BREAST BILATERAL   Colon cancer screening       Relevant Orders   Ambulatory referral to Gastroenterology      1) Mammogram - recommend yearly screening mammogram.  Mammogram Was ordered today  2) STI screening  was notoffered and therefore not obtained  3) ASCCP guidelines and rational discussed.  Patient opts for every 3 years screening interval  4) Osteoporosis  - per USPTF routine screening DEXA at age 60  5) Routine healthcare maintenance including cholesterol, diabetes screening discussed managed by PCP  6) Colonoscopy - colonoscopy  ordered  7) Telephone time 18:55 minutes  8) Return in about 1 year (around 05/28/2021) for annual.    Malachy Mood, MD Mosetta Pigeon, [facility] 05/28/2020, 8:30 AM

## 2020-06-03 ENCOUNTER — Other Ambulatory Visit: Payer: Self-pay

## 2020-06-03 DIAGNOSIS — Z1211 Encounter for screening for malignant neoplasm of colon: Secondary | ICD-10-CM

## 2020-06-04 ENCOUNTER — Other Ambulatory Visit: Payer: Self-pay

## 2020-06-04 ENCOUNTER — Telehealth (INDEPENDENT_AMBULATORY_CARE_PROVIDER_SITE_OTHER): Payer: Self-pay | Admitting: Gastroenterology

## 2020-06-04 DIAGNOSIS — Z1211 Encounter for screening for malignant neoplasm of colon: Secondary | ICD-10-CM

## 2020-06-04 MED ORDER — CLENPIQ 10-3.5-12 MG-GM -GM/160ML PO SOLN: 320.0000 mL | Freq: Once | ORAL | 0 refills | Status: AC

## 2020-06-04 NOTE — Progress Notes (Signed)
Gastroenterology Pre-Procedure Review  Request Date: 06/30/2020 Requesting Physician: Dr. Maximino Greenland   PATIENT REVIEW QUESTIONS: The patient responded to the following health history questions as indicated:    1. Are you having any GI issues? no 2. Do you have a personal history of Polyps? no 3. Do you have a family history of Colon Cancer or Polyps? no 4. Diabetes Mellitus? no 5. Joint replacements in the past 12 months?no 6. Major health problems in the past 3 months?no 7. Any artificial heart valves, MVP, or defibrillator?no    MEDICATIONS & ALLERGIES:    Patient reports the following regarding taking any anticoagulation/antiplatelet therapy:   Plavix, Coumadin, Eliquis, Xarelto, Lovenox, Pradaxa, Brilinta, or Effient? no Aspirin? no  Patient confirms/reports the following medications:  Current Outpatient Medications  Medication Sig Dispense Refill  . ALPRAZolam (XANAX) 0.25 MG tablet Take 1 tablet by mouth up to once daily as needed, Not take daily. 30 tablet 0  . calcipotriene-betamethasone (TACLONEX) ointment Apply 1 application topically daily. Not use on face, armpit, groin. Not use longer than 4 weeks. 60 g 1  . Cholecalciferol (VITAMIN D3 PO) Take by mouth.    . clobetasol ointment (TEMOVATE) 0.05 % Apply 1 application topically every morning.    . Fluocinolone Acetonide 0.01 % OIL Place 5 drops in ear(s) as needed. Use no more than 5 days 20 mL 1  . Multiple Vitamin (MULTI-VITAMIN) tablet Take by mouth.    . triamcinolone cream (KENALOG) 0.1 % Apply 1 application topically 2 (two) times daily. Use sparingly for areas under the armpit region 30 g 1  . vitamin B-12 (CYANOCOBALAMIN) 1000 MCG tablet Take by mouth.     No current facility-administered medications for this visit.    Patient confirms/reports the following allergies:  No Known Allergies  No orders of the defined types were placed in this encounter.   AUTHORIZATION INFORMATION Primary  Insurance: 1D#: Group #:  Secondary Insurance: 1D#: Group #:  SCHEDULE INFORMATION: Date:  Time: Location:

## 2020-06-10 ENCOUNTER — Other Ambulatory Visit: Payer: Self-pay | Admitting: Physician Assistant

## 2020-06-13 ENCOUNTER — Telehealth: Payer: Self-pay

## 2020-06-13 NOTE — Telephone Encounter (Signed)
Script sent to pharmacy.

## 2020-06-30 ENCOUNTER — Other Ambulatory Visit
Admission: RE | Admit: 2020-06-30 | Discharge: 2020-06-30 | Disposition: A | Discharge status: Home / Self Care | Payer: 59 | Source: Ambulatory Visit | Attending: Gastroenterology | Admitting: Gastroenterology

## 2020-06-30 ENCOUNTER — Other Ambulatory Visit: Payer: Self-pay

## 2020-06-30 DIAGNOSIS — Z20822 Contact with and (suspected) exposure to covid-19: Secondary | ICD-10-CM | POA: Diagnosis not present

## 2020-06-30 DIAGNOSIS — Z01812 Encounter for preprocedural laboratory examination: Secondary | ICD-10-CM | POA: Diagnosis not present

## 2020-07-01 LAB — SARS CORONAVIRUS 2 (TAT 6-24 HRS): SARS Coronavirus 2: NEGATIVE

## 2020-07-02 ENCOUNTER — Ambulatory Visit: Payer: 59 | Admitting: Anesthesiology

## 2020-07-02 ENCOUNTER — Other Ambulatory Visit: Payer: Self-pay

## 2020-07-02 ENCOUNTER — Encounter
Admission: RE | Disposition: A | Discharge status: Home / Self Care | Payer: Self-pay | Source: Home / Self Care | Attending: Gastroenterology

## 2020-07-02 ENCOUNTER — Ambulatory Visit
Admission: RE | Admit: 2020-07-02 | Discharge: 2020-07-02 | Disposition: A | Discharge status: Home / Self Care | Payer: 59 | Attending: Gastroenterology | Admitting: Gastroenterology

## 2020-07-02 ENCOUNTER — Encounter: Payer: Self-pay | Admitting: Gastroenterology

## 2020-07-02 DIAGNOSIS — Z803 Family history of malignant neoplasm of breast: Secondary | ICD-10-CM | POA: Insufficient documentation

## 2020-07-02 DIAGNOSIS — Z8249 Family history of ischemic heart disease and other diseases of the circulatory system: Secondary | ICD-10-CM | POA: Insufficient documentation

## 2020-07-02 DIAGNOSIS — K5730 Diverticulosis of large intestine without perforation or abscess without bleeding: Secondary | ICD-10-CM | POA: Diagnosis not present

## 2020-07-02 DIAGNOSIS — Z79899 Other long term (current) drug therapy: Secondary | ICD-10-CM | POA: Insufficient documentation

## 2020-07-02 DIAGNOSIS — L409 Psoriasis, unspecified: Secondary | ICD-10-CM | POA: Insufficient documentation

## 2020-07-02 DIAGNOSIS — Z825 Family history of asthma and other chronic lower respiratory diseases: Secondary | ICD-10-CM | POA: Diagnosis not present

## 2020-07-02 DIAGNOSIS — Z1211 Encounter for screening for malignant neoplasm of colon: Secondary | ICD-10-CM

## 2020-07-02 DIAGNOSIS — R69 Illness, unspecified: Secondary | ICD-10-CM | POA: Diagnosis not present

## 2020-07-02 DIAGNOSIS — F17200 Nicotine dependence, unspecified, uncomplicated: Secondary | ICD-10-CM | POA: Diagnosis not present

## 2020-07-02 DIAGNOSIS — F419 Anxiety disorder, unspecified: Secondary | ICD-10-CM | POA: Insufficient documentation

## 2020-07-02 DIAGNOSIS — Z841 Family history of disorders of kidney and ureter: Secondary | ICD-10-CM | POA: Insufficient documentation

## 2020-07-02 HISTORY — PX: COLONOSCOPY WITH PROPOFOL: SHX5780

## 2020-07-02 SURGERY — COLONOSCOPY WITH PROPOFOL
Anesthesia: General

## 2020-07-02 MED ORDER — PROPOFOL 500 MG/50ML IV EMUL
INTRAVENOUS | Status: DC | PRN
  Administered 2020-07-02: 165 ug/kg/min via INTRAVENOUS

## 2020-07-02 MED ORDER — PROPOFOL 10 MG/ML IV BOLUS
INTRAVENOUS | Status: DC | PRN
  Administered 2020-07-02: 10 mg via INTRAVENOUS
  Administered 2020-07-02: 40 mg via INTRAVENOUS
  Administered 2020-07-02: 10 mg via INTRAVENOUS

## 2020-07-02 MED ORDER — LIDOCAINE HCL (CARDIAC) PF 100 MG/5ML IV SOSY
PREFILLED_SYRINGE | INTRAVENOUS | Status: DC | PRN
  Administered 2020-07-02: 100 mg via INTRAVENOUS

## 2020-07-02 MED ORDER — EPHEDRINE SULFATE 50 MG/ML IJ SOLN
INTRAMUSCULAR | Status: DC | PRN
  Administered 2020-07-02: 5 mg via INTRAVENOUS
  Administered 2020-07-02: 7.5 mg via INTRAVENOUS

## 2020-07-02 MED ORDER — SODIUM CHLORIDE 0.9 % IV SOLN: INTRAVENOUS | Status: DC

## 2020-07-02 MED ORDER — PROPOFOL 500 MG/50ML IV EMUL
INTRAVENOUS | Status: AC
  Filled 2020-07-02: qty 300

## 2020-07-02 NOTE — Anesthesia Preprocedure Evaluation (Signed)
Anesthesia Evaluation  Patient identified by MRN, date of birth, ID band Patient awake    Reviewed: Allergy & Precautions, H&P , NPO status , Patient's Chart, lab work & pertinent test results, reviewed documented beta blocker date and time   Airway Mallampati: II   Neck ROM: full    Dental  (+) Poor Dentition   Pulmonary neg pulmonary ROS, Current Smoker and Patient abstained from smoking.,    Pulmonary exam normal        Cardiovascular Exercise Tolerance: Good negative cardio ROS Normal cardiovascular exam Rhythm:regular Rate:Normal     Neuro/Psych Anxiety negative neurological ROS  negative psych ROS   GI/Hepatic negative GI ROS, Neg liver ROS,   Endo/Other  negative endocrine ROS  Renal/GU negative Renal ROS  negative genitourinary   Musculoskeletal   Abdominal   Peds  Hematology negative hematology ROS (+)   Anesthesia Other Findings Past Medical History: No date: Anxiety No date: Psoriasis Past Surgical History: 90s: BREAST CYST ASPIRATION; Left 06/2010: COLONOSCOPY No date: TUBAL LIGATION BMI    Body Mass Index: 24.28 kg/m     Reproductive/Obstetrics negative OB ROS                             Anesthesia Physical Anesthesia Plan  ASA: II  Anesthesia Plan: General   Post-op Pain Management:    Induction:   PONV Risk Score and Plan:   Airway Management Planned:   Additional Equipment:   Intra-op Plan:   Post-operative Plan:   Informed Consent: I have reviewed the patients History and Physical, chart, labs and discussed the procedure including the risks, benefits and alternatives for the proposed anesthesia with the patient or authorized representative who has indicated his/her understanding and acceptance.     Dental Advisory Given  Plan Discussed with: CRNA  Anesthesia Plan Comments:         Anesthesia Quick Evaluation

## 2020-07-02 NOTE — Anesthesia Procedure Notes (Signed)
Procedure Name: General with mask airway Performed by: Fletcher-Harrison, Blaine Hari, CRNA Pre-anesthesia Checklist: Patient identified, Emergency Drugs available, Suction available and Patient being monitored Patient Re-evaluated:Patient Re-evaluated prior to induction Oxygen Delivery Method: Simple face mask Induction Type: IV induction Placement Confirmation: positive ETCO2 and CO2 detector Dental Injury: Teeth and Oropharynx as per pre-operative assessment        

## 2020-07-02 NOTE — Op Note (Signed)
[facility] Gastroenterology Patient Name: Virginia Galvan Procedure Date: 07/02/2020 7:28 AM MRN: [id number] Account #: [id number] Date of Birth: 1961-11-14 Admit Type: Outpatient Age: 59 Room: [facility] ENDO ROOM 4 Gender: Female Note Status: Finalized Procedure:             Colonoscopy Indications:           Screening for colorectal malignant neoplasm Providers:             Luis Nickles B. Maximino Greenland MD, MD Referring MD:          Sallye Lat Md, MD (Referring MD) Medicines:             Monitored Anesthesia Care Complications:         No immediate complications. Procedure:             Pre-Anesthesia Assessment:                        - Prior to the procedure, a History and Physical was                         performed, and patient medications, allergies and                         sensitivities were reviewed. The patient's tolerance                         of previous anesthesia was reviewed.                        - The risks and benefits of the procedure and the                         sedation options and risks were discussed with the                         patient. All questions were answered and informed                         consent was obtained.                        - Patient identification and proposed procedure were                         verified prior to the procedure by the physician, the                         nurse, the anesthetist and the technician. The                         procedure was verified in the pre-procedure area in                         the procedure room in the endoscopy suite.                        - ASA Grade Assessment: II - A patient with mild  systemic disease.                        - After reviewing the risks and benefits, the patient                         was deemed in satisfactory condition to undergo the                         procedure.                        After obtaining informed consent, the  colonoscope was                         passed under direct vision. Throughout the procedure,                         the patient's blood pressure, pulse, and oxygen                         saturations were monitored continuously. The                         Colonoscope was introduced through the anus and                         advanced to the the cecum, identified by appendiceal                         orifice and ileocecal valve. The colonoscopy was                         performed with ease. The patient tolerated the                         procedure well. The quality of the bowel preparation                         was good. Findings:      The perianal and digital rectal examinations were normal.      A single diverticulum was found in the sigmoid colon.      The exam was otherwise without abnormality.      The rectum, sigmoid colon, descending colon, transverse colon, ascending       colon and cecum appeared normal.      The retroflexed view of the distal rectum and anal verge was normal and       showed no anal or rectal abnormalities. Impression:            - Diverticulosis in the sigmoid colon.                        - The examination was otherwise normal.                        - The rectum, sigmoid colon, descending colon,                         transverse colon, ascending colon and cecum are normal.                        -  The distal rectum and anal verge are normal on                         retroflexion view.                        - No specimens collected. Recommendation:        - Discharge patient to home.                        - Resume previous diet.                        - Continue present medications.                        - Repeat colonoscopy in 10 years for screening                         purposes.                        - Return to primary care physician as previously                         scheduled.                        - The findings and  recommendations were discussed with                         the patient.                        - The findings and recommendations were discussed with                         the patient's family.                        - High fiber diet.                        - In the future, if patient develops new symptoms such                         as blood per rectum, abdominal pain, weight loss,                         altered bowel habits or any other reason for concern,                         patient should discuss this with thier PCP as they may                         need a GI referral at that time or evaluation for need                         for colonoscopy earlier than the recommended screening  colonoscopy.                        In addition, if patient's family history of colon                         cancer changes (no family history at this time) in the                         future, earlier screening may be indicated and patient                         should discuss this with PCP as well. Procedure Code(s):     --- Professional ---                        [phone], Colonoscopy, flexible; diagnostic, including                         collection of specimen(s) by brushing or washing, when                         performed (separate procedure) Diagnosis Code(s):     --- Professional ---                        Z12.11, Encounter for screening for malignant neoplasm                         of colon CPT copyright 2019 American Medical Association. All rights reserved. The codes documented in this report are preliminary and upon coder review may  be revised to meet current compliance requirements.  Melodie Bouillon, MD Michel Bickers B. Maximino Greenland MD, MD 07/02/2020 8:58:13 AM This report has been signed electronically. Number of Addenda: 0 Note Initiated On: 07/02/2020 7:28 AM Scope Withdrawal Time: 0 hours 24 minutes 20 seconds  Total Procedure Duration: 0 hours 32  minutes 23 seconds       [facility]

## 2020-07-02 NOTE — H&P (Signed)
Virginia Bouillon, MD [address], [place], [place], [address] [address], [address], [place], [place], [address] Phone: [phone]  Fax: [phone]  Primary Care Physician:  Patient, No Pcp Per   Pre-Procedure History & Physical: HPI:  Virginia Galvan is a 59 y.o. female is here for a colonoscopy.   Past Medical History:  Diagnosis Date  . Anxiety   . Psoriasis     Past Surgical History:  Procedure Laterality Date  . BREAST CYST ASPIRATION Left 90s  . COLONOSCOPY  06/2010  . TUBAL LIGATION      Prior to Admission medications   Medication Sig Start Date End Date Taking? Authorizing Provider  ALPRAZolam (XANAX) 0.25 MG tablet Take 1 tablet (0.25 mg total) by mouth 2 (two) times daily as needed for anxiety. 06/13/20   Joni Reining, PA-C  calcipotriene-betamethasone (TACLONEX) ointment Apply 1 application topically daily. Not use on face, armpit, groin. Not use longer than 4 weeks. 08/08/19   Jamelle Haring, MD  Cholecalciferol (VITAMIN D3 PO) Take by mouth.    [provider]  clobetasol ointment (TEMOVATE) 0.05 % Apply 1 application topically every morning.    [provider]  Fluocinolone Acetonide 0.01 % OIL Place 5 drops in ear(s) as needed. Use no more than 5 days 08/08/19   Jamelle Haring, MD  Multiple Vitamin (MULTI-VITAMIN) tablet Take by mouth.    [provider]  triamcinolone cream (KENALOG) 0.1 % Apply 1 application topically 2 (two) times daily. Use sparingly for areas under the armpit region 08/08/19   Jamelle Haring, MD  vitamin B-12 (CYANOCOBALAMIN) 1000 MCG tablet Take by mouth.    [provider]    Allergies as of 06/04/2020  . (No Known Allergies)    Family History  Problem Relation Age of Onset  . Breast cancer Maternal Grandmother   . Asthma Mother   . COPD Mother   . Kidney disease Mother   . Heart attack Father     Social History   Socioeconomic History  . Marital  status: Married    Spouse name: Not on file  . Number of children: Not on file  . Years of education: Not on file  . Highest education level: Not on file  Occupational History  . Not on file  Tobacco Use  . Smoking status: Light Tobacco Smoker  . Smokeless tobacco: Never Used  Vaping Use  . Vaping Use: Never used  Substance and Sexual Activity  . Alcohol use: Yes    Comment: Occ  . Drug use: No  . Sexual activity: Never    Birth control/protection: Post-menopausal  Other Topics Concern  . Not on file  Social History Narrative  . Not on file   Social Determinants of Health   Financial Resource Strain:   . Difficulty of Paying Living Expenses:   Food Insecurity:   . Worried About Programme researcher, broadcasting/film/video in the Last Year:   . Barista in the Last Year:   Transportation Needs:   . Freight forwarder (Medical):   Marland Kitchen Lack of Transportation (Non-Medical):   Physical Activity: Unknown  . Days of Exercise per Week: 4 days  . Minutes of Exercise per Session: Not on file  Stress:   . Feeling of Stress :   Social Connections:   . Frequency of Communication with Friends and Family:   . Frequency of Social Gatherings with Friends and Family:   . Attends Religious  Services:   . Active Member of Clubs or Organizations:   . Attends Banker Meetings:   Marland Kitchen Marital Status:   Intimate Partner Violence:   . Fear of Current or Ex-Partner:   . Emotionally Abused:   Marland Kitchen Physically Abused:   . Sexually Abused:     Review of Systems: See HPI, otherwise negative ROS  Physical Exam: BP 111/69   Pulse 77   Temp (!) 97.1 F (36.2 C) (Temporal)   Resp 17   Ht 5\' 7"  (1.702 m)   Wt 70.3 kg   SpO2 100%   BMI 24.28 kg/m  General:   Alert,  pleasant and cooperative in NAD Head:  Normocephalic and atraumatic. Neck:  Supple; no masses or thyromegaly. Lungs:  Clear throughout to auscultation, normal respiratory effort.    Heart:  +S1, +S2, Regular rate and rhythm, No  edema. Abdomen:  Soft, nontender and nondistended. Normal bowel sounds, without guarding, and without rebound.   Neurologic:  Alert and  oriented x4;  grossly normal neurologically.  Impression/Plan: Virginia Galvan is here for a colonoscopy to be performed for average risk screening.  Risks, benefits, limitations, and alternatives regarding  colonoscopy have been reviewed with the patient.  Questions have been answered.  All parties agreeable.   Lesia Hausen, MD  07/02/2020, 8:15 AM

## 2020-07-02 NOTE — Transfer of Care (Signed)
Immediate Anesthesia Transfer of Care Note  Patient: Virginia Galvan  Procedure(s) Performed: COLONOSCOPY WITH PROPOFOL (N/A )  Patient Location: Endoscopy Unit  Anesthesia Type:General  Level of Consciousness: drowsy and patient cooperative  Airway & Oxygen Therapy: Patient Spontanous Breathing and Patient connected to face mask oxygen  Post-op Assessment: Report given to RN and Post -op Vital signs reviewed and stable  Post vital signs: Reviewed and stable  Last Vitals:  Vitals Value Taken Time  BP    Temp    Pulse 67 07/02/20 0856  Resp 14 07/02/20 0856  SpO2 100 % 07/02/20 0856  Vitals shown include unvalidated device data.  Last Pain:  Vitals:   07/02/20 0728  TempSrc: Temporal  PainSc: 0-No pain         Complications: No complications documented.

## 2020-07-02 NOTE — Anesthesia Postprocedure Evaluation (Signed)
Anesthesia Post Note  Patient: Virginia Galvan  Procedure(s) Performed: COLONOSCOPY WITH PROPOFOL (N/A )  Patient location during evaluation: Endoscopy Anesthesia Type: General Level of consciousness: awake and alert Pain management: pain level controlled Vital Signs Assessment: post-procedure vital signs reviewed and stable Respiratory status: spontaneous breathing, nonlabored ventilation, respiratory function stable and patient connected to nasal cannula oxygen Cardiovascular status: blood pressure returned to baseline and stable Postop Assessment: no apparent nausea or vomiting Anesthetic complications: no   No complications documented.   Last Vitals:  Vitals:   07/02/20 0920 07/02/20 0930  BP: (!) 101/50 (!) 110/63  Pulse: 68 66  Resp: 15 16  Temp:    SpO2: 99% 100%    Last Pain:  Vitals:   07/02/20 0856  TempSrc: Temporal  PainSc:                  Lenard Simmer

## 2020-07-03 ENCOUNTER — Encounter: Payer: Self-pay | Admitting: Gastroenterology

## 2020-07-28 ENCOUNTER — Other Ambulatory Visit: Payer: Self-pay | Admitting: Physician Assistant

## 2020-07-28 DIAGNOSIS — F411 Generalized anxiety disorder: Secondary | ICD-10-CM

## 2020-07-28 DIAGNOSIS — L409 Psoriasis, unspecified: Secondary | ICD-10-CM

## 2020-07-28 MED ORDER — CALCIPOTRIENE-BETAMETH DIPROP 0.005-0.064 % EX OINT: TOPICAL_OINTMENT | CUTANEOUS | 1 refills | Status: DC

## 2020-08-06 ENCOUNTER — Other Ambulatory Visit: Payer: Self-pay

## 2020-08-06 ENCOUNTER — Ambulatory Visit
Admission: RE | Admit: 2020-08-06 | Discharge: 2020-08-06 | Disposition: A | Discharge status: Home / Self Care | Payer: 59 | Source: Ambulatory Visit | Attending: Obstetrics and Gynecology | Admitting: Obstetrics and Gynecology

## 2020-08-06 DIAGNOSIS — Z1231 Encounter for screening mammogram for malignant neoplasm of breast: Secondary | ICD-10-CM | POA: Diagnosis not present

## 2020-08-06 DIAGNOSIS — Z1239 Encounter for other screening for malignant neoplasm of breast: Secondary | ICD-10-CM

## 2020-10-22 DIAGNOSIS — Z20822 Contact with and (suspected) exposure to covid-19: Secondary | ICD-10-CM | POA: Diagnosis not present

## 2020-10-22 DIAGNOSIS — J019 Acute sinusitis, unspecified: Secondary | ICD-10-CM | POA: Diagnosis not present

## 2020-11-03 DIAGNOSIS — L408 Other psoriasis: Secondary | ICD-10-CM | POA: Diagnosis not present

## 2020-11-03 DIAGNOSIS — L400 Psoriasis vulgaris: Secondary | ICD-10-CM | POA: Diagnosis not present

## 2020-11-11 ENCOUNTER — Other Ambulatory Visit: Payer: Self-pay | Admitting: Emergency Medicine

## 2020-11-11 DIAGNOSIS — F411 Generalized anxiety disorder: Secondary | ICD-10-CM

## 2021-02-10 DIAGNOSIS — D2261 Melanocytic nevi of right upper limb, including shoulder: Secondary | ICD-10-CM | POA: Diagnosis not present

## 2021-02-10 DIAGNOSIS — D2262 Melanocytic nevi of left upper limb, including shoulder: Secondary | ICD-10-CM | POA: Diagnosis not present

## 2021-02-10 DIAGNOSIS — L400 Psoriasis vulgaris: Secondary | ICD-10-CM | POA: Diagnosis not present

## 2021-02-10 DIAGNOSIS — D485 Neoplasm of uncertain behavior of skin: Secondary | ICD-10-CM | POA: Diagnosis not present

## 2021-02-10 DIAGNOSIS — D2272 Melanocytic nevi of left lower limb, including hip: Secondary | ICD-10-CM | POA: Diagnosis not present

## 2021-02-10 DIAGNOSIS — L408 Other psoriasis: Secondary | ICD-10-CM | POA: Diagnosis not present

## 2021-02-10 DIAGNOSIS — D225 Melanocytic nevi of trunk: Secondary | ICD-10-CM | POA: Diagnosis not present

## 2021-03-16 DIAGNOSIS — H5213 Myopia, bilateral: Secondary | ICD-10-CM | POA: Diagnosis not present

## 2021-03-19 ENCOUNTER — Ambulatory Visit: Payer: Self-pay

## 2021-03-19 ENCOUNTER — Other Ambulatory Visit: Payer: Self-pay

## 2021-03-19 DIAGNOSIS — Z01818 Encounter for other preprocedural examination: Secondary | ICD-10-CM

## 2021-03-19 LAB — POCT URINALYSIS DIPSTICK
Bilirubin, UA: NEGATIVE
Blood, UA: NEGATIVE
Glucose, UA: NEGATIVE
Ketones, UA: NEGATIVE
Leukocytes, UA: NEGATIVE
Nitrite, UA: NEGATIVE
Protein, UA: NEGATIVE
Spec Grav, UA: 1.015 (ref 1.010–1.025)
Urobilinogen, UA: 0.2 E.U./dL
pH, UA: 6 (ref 5.0–8.0)

## 2021-03-19 NOTE — Progress Notes (Signed)
Pt scheduled 03/25/21 with Laray Anger to complete physical. CL,RMA

## 2021-03-20 LAB — CMP12+LP+TP+TSH+6AC+CBC/D/PLT
ALT: 13 IU/L (ref 0–32)
AST: 20 IU/L (ref 0–40)
Albumin/Globulin Ratio: 1.9 (ref 1.2–2.2)
Albumin: 4.6 g/dL (ref 3.8–4.9)
Alkaline Phosphatase: 106 IU/L (ref 44–121)
BUN/Creatinine Ratio: 12 (ref 12–28)
BUN: 12 mg/dL (ref 8–27)
Basophils Absolute: 0.1 10*3/uL (ref 0.0–0.2)
Basos: 1 %
Bilirubin Total: 0.4 mg/dL (ref 0.0–1.2)
Calcium: 10 mg/dL (ref 8.7–10.3)
Chloride: 104 mmol/L (ref 96–106)
Chol/HDL Ratio: 4.3 ratio (ref 0.0–4.4)
Cholesterol, Total: 205 mg/dL — ABNORMAL HIGH (ref 100–199)
Creatinine, Ser: 1 mg/dL (ref 0.57–1.00)
EOS (ABSOLUTE): 0.1 10*3/uL (ref 0.0–0.4)
Eos: 1 %
Estimated CHD Risk: 1 times avg. (ref 0.0–1.0)
Free Thyroxine Index: 1.6 (ref 1.2–4.9)
GGT: 16 IU/L (ref 0–60)
Globulin, Total: 2.4 g/dL (ref 1.5–4.5)
Glucose: 85 mg/dL (ref 65–99)
HDL: 48 mg/dL (ref >39)
Hematocrit: 40.5 % (ref 34.0–46.6)
Hemoglobin: 13.5 g/dL (ref 11.1–15.9)
Immature Grans (Abs): 0 10*3/uL (ref 0.0–0.1)
Immature Granulocytes: 0 %
Iron: 106 ug/dL (ref 27–159)
LDH: 158 IU/L (ref 119–226)
LDL Chol Calc (NIH): 132 mg/dL — ABNORMAL HIGH (ref 0–99)
Lymphocytes Absolute: 1.2 10*3/uL (ref 0.7–3.1)
Lymphs: 22 %
MCH: 29.7 pg (ref 26.6–33.0)
MCHC: 33.3 g/dL (ref 31.5–35.7)
MCV: 89 fL (ref 79–97)
Monocytes Absolute: 0.5 10*3/uL (ref 0.1–0.9)
Monocytes: 9 %
Neutrophils Absolute: 3.8 10*3/uL (ref 1.4–7.0)
Neutrophils: 67 %
Phosphorus: 3.7 mg/dL (ref 3.0–4.3)
Platelets: 323 10*3/uL (ref 150–450)
Potassium: 5.1 mmol/L (ref 3.5–5.2)
RBC: 4.55 x10E6/uL (ref 3.77–5.28)
RDW: 12 % (ref 11.7–15.4)
Sodium: 141 mmol/L (ref 134–144)
T3 Uptake Ratio: 24 % (ref 24–39)
T4, Total: 6.6 ug/dL (ref 4.5–12.0)
TSH: 2.53 u[IU]/mL (ref 0.450–4.500)
Total Protein: 7 g/dL (ref 6.0–8.5)
Triglycerides: 137 mg/dL (ref 0–149)
Uric Acid: 5.2 mg/dL (ref 3.0–7.2)
VLDL Cholesterol Cal: 25 mg/dL (ref 5–40)
WBC: 5.7 10*3/uL (ref 3.4–10.8)
eGFR: 64 mL/min/{1.73_m2} (ref >59)

## 2021-03-25 ENCOUNTER — Other Ambulatory Visit: Payer: Self-pay

## 2021-03-25 ENCOUNTER — Encounter: Payer: Self-pay | Admitting: Physician Assistant

## 2021-03-25 ENCOUNTER — Ambulatory Visit: Payer: Self-pay | Admitting: Physician Assistant

## 2021-03-25 VITALS — BP 103/66 | Temp 97.6°F | Resp 14 | Ht 67.0 in | Wt 155.0 lb

## 2021-03-25 DIAGNOSIS — Z0000 Encounter for general adult medical examination without abnormal findings: Secondary | ICD-10-CM

## 2021-03-25 NOTE — Progress Notes (Signed)
   Subjective: Annual physical exam    Patient ID: Virginia Galvan, female    DOB: 11-29-1961, 60 y.o.   MRN: [id number]  HPI Patient presents annual physical exam.  Voices no concerns or complaints.    Review of Systems Anxiety and arthritis    Objective:   Physical Exam No acute distress.  Temperature is 97.6, pulse 70, respiration 14, BP is 103/66, patient is 97% O2 sat on room air. HEENT is unremarkable.  Neck is supple adenopathy or bruits.  Lungs clear to auscultation.  Heart regular rate and rhythm.  No acute findings on EKG. Negative HSM, normoactive bowel sounds, soft, and nontender to palpation. Patient has bilateral Heberden's nodes of the distal phalanx of the hands.  No obvious deformity of the lower extremities.  Patient has full and equal range of motion of the upper and lower extremities. No obvious cervical lumbar spine deformity.  Patient has full and equal range of motion of the cervical lumbar spine.  Cranial nerves II through XII grossly intact.     Assessment & Plan: Well exam  Discussed lab results with patient showing a slight elevation of cholesterol at 205.  Discussed lifestyle changes and diet.  No acute findings on lab results.  Follow-up as needed.

## 2021-04-08 ENCOUNTER — Other Ambulatory Visit: Payer: Self-pay | Admitting: Physician Assistant

## 2021-04-08 MED ORDER — MELOXICAM 15 MG PO TABS: 15.0000 mg | ORAL_TABLET | Freq: Every day | ORAL | 0 refills | Status: DC

## 2021-05-13 ENCOUNTER — Other Ambulatory Visit: Payer: Self-pay

## 2021-05-13 DIAGNOSIS — F411 Generalized anxiety disorder: Secondary | ICD-10-CM

## 2021-05-13 MED ORDER — ALPRAZOLAM 0.25 MG PO TABS: 0.2500 mg | ORAL_TABLET | Freq: Two times a day (BID) | ORAL | 0 refills | Status: DC | PRN

## 2021-07-03 ENCOUNTER — Ambulatory Visit: Payer: 59 | Admitting: Obstetrics and Gynecology

## 2021-07-04 DIAGNOSIS — J069 Acute upper respiratory infection, unspecified: Secondary | ICD-10-CM | POA: Diagnosis not present

## 2021-07-24 ENCOUNTER — Ambulatory Visit: Payer: 59 | Admitting: Obstetrics and Gynecology

## 2021-09-09 ENCOUNTER — Telehealth: Payer: Self-pay

## 2021-09-09 DIAGNOSIS — Z1231 Encounter for screening mammogram for malignant neoplasm of breast: Secondary | ICD-10-CM

## 2021-09-09 NOTE — Telephone Encounter (Signed)
Virginia Galvan called requesting order for annual mammogram.   States she see Dr. Chauncey Cruel (OBGYN) later this year.  Completed annual physical 03/25/21 with Nona Dell, PA-C.  AMD

## 2021-10-09 ENCOUNTER — Other Ambulatory Visit: Payer: Self-pay

## 2021-10-09 ENCOUNTER — Ambulatory Visit
Admission: RE | Admit: 2021-10-09 | Discharge: 2021-10-09 | Disposition: A | Discharge status: Home / Self Care | Payer: 59 | Source: Ambulatory Visit | Attending: Physician Assistant | Admitting: Physician Assistant

## 2021-10-09 DIAGNOSIS — Z1231 Encounter for screening mammogram for malignant neoplasm of breast: Secondary | ICD-10-CM | POA: Insufficient documentation

## 2021-10-26 ENCOUNTER — Other Ambulatory Visit: Payer: Self-pay | Admitting: Physician Assistant

## 2021-10-26 MED ORDER — MELOXICAM 15 MG PO TABS: 15.0000 mg | ORAL_TABLET | Freq: Every day | ORAL | 0 refills | Status: DC

## 2021-12-14 ENCOUNTER — Other Ambulatory Visit: Payer: Self-pay

## 2021-12-14 DIAGNOSIS — L4050 Arthropathic psoriasis, unspecified: Secondary | ICD-10-CM

## 2021-12-14 MED ORDER — MELOXICAM 15 MG PO TABS: 15.0000 mg | ORAL_TABLET | Freq: Every day | ORAL | 5 refills | Status: DC

## 2022-02-11 ENCOUNTER — Other Ambulatory Visit: Payer: Self-pay

## 2022-02-11 DIAGNOSIS — F411 Generalized anxiety disorder: Secondary | ICD-10-CM

## 2022-02-11 MED ORDER — ALPRAZOLAM 0.25 MG PO TABS: 0.2500 mg | ORAL_TABLET | Freq: Two times a day (BID) | ORAL | 0 refills | Status: DC | PRN

## 2022-02-15 DIAGNOSIS — D2272 Melanocytic nevi of left lower limb, including hip: Secondary | ICD-10-CM | POA: Diagnosis not present

## 2022-02-15 DIAGNOSIS — L814 Other melanin hyperpigmentation: Secondary | ICD-10-CM | POA: Diagnosis not present

## 2022-02-15 DIAGNOSIS — D2261 Melanocytic nevi of right upper limb, including shoulder: Secondary | ICD-10-CM | POA: Diagnosis not present

## 2022-02-15 DIAGNOSIS — X32XXXA Exposure to sunlight, initial encounter: Secondary | ICD-10-CM | POA: Diagnosis not present

## 2022-02-15 DIAGNOSIS — L400 Psoriasis vulgaris: Secondary | ICD-10-CM | POA: Diagnosis not present

## 2022-02-15 DIAGNOSIS — D2262 Melanocytic nevi of left upper limb, including shoulder: Secondary | ICD-10-CM | POA: Diagnosis not present

## 2022-03-10 ENCOUNTER — Encounter: Payer: 59 | Admitting: Physician Assistant

## 2022-04-08 ENCOUNTER — Ambulatory Visit: Payer: Self-pay | Admitting: Physician Assistant

## 2022-04-08 ENCOUNTER — Encounter: Payer: Self-pay | Admitting: Physician Assistant

## 2022-04-08 VITALS — BP 116/70 | HR 73 | Temp 97.7°F | Resp 16

## 2022-04-08 DIAGNOSIS — R350 Frequency of micturition: Secondary | ICD-10-CM

## 2022-04-08 LAB — POCT URINALYSIS DIPSTICK
Bilirubin, UA: NEGATIVE
Blood, UA: NEGATIVE
Glucose, UA: NEGATIVE
Ketones, UA: NEGATIVE
Leukocytes, UA: NEGATIVE
Nitrite, UA: NEGATIVE
Protein, UA: NEGATIVE
Spec Grav, UA: 1.015 (ref 1.010–1.025)
Urobilinogen, UA: 0.2 E.U./dL
pH, UA: 6 (ref 5.0–8.0)

## 2022-04-08 NOTE — Progress Notes (Signed)
? ?  Subjective:  ? ? Patient ID: Virginia Galvan, female    DOB: [birth year], 61 y.o.   MRN: [id number] ? ?HPI  61 yo F presents concerned about one experience of darker than normal urine ?Color early morning void. Denies fever, dysuria, low back, pelvic  or abdominal pain. Denies pelvic pressure. PMP, denies coitus or dysuria. No unusual foods or spices, routine medications, no known food dyes. Denies urine odor or vaginal discharge. May have had brief episode of frequency. ? ?Discussed simple concentration as possibly influential. Recognizes that she is better with drinking daily water at the office and often forgets at home. ? ?Review of Systems  ?All other systems reviewed and are negative. ? ?   ?Objective:  ? Physical Exam ?Constitutional:   ?   General: She is not in acute distress. ?   Appearance: Normal appearance. She is not ill-appearing.  ?HENT:  ?   Head: Normocephalic and atraumatic.  ?   Mouth/Throat:  ?   Mouth: Mucous membranes are moist.  ?Eyes:  ?   Extraocular Movements: Extraocular movements intact.  ?Pulmonary:  ?   Effort: Pulmonary effort is normal.  ?Abdominal:  ?   General: There is no distension.  ?   Palpations: Abdomen is soft. There is no mass.  ?   Tenderness: There is no abdominal tenderness. There is no right CVA tenderness, left CVA tenderness or guarding.  ?Genitourinary: ?   Comments: Defer- clinic not equipped ?Musculoskeletal:     ?   General: Normal range of motion.  ?   Cervical back: Neck supple.  ?Skin: ?   General: Skin is warm and dry.  ?Neurological:  ?   Mental Status: She is alert.  ? ?   ?Assessment & Plan:  ?U/A  neg across report ? ?Will submit specimen for culture for completeness /patient concern. ?Reviewed possible influences noted above -encourage hydration,  ?RTC with questions or concerns ? ?My Chart should have lab after the weekend- call clinic for staff assistance prn. ? ? ? ? ? ?

## 2022-04-08 NOTE — Progress Notes (Signed)
S/Sx couple days: ?Dark urine this AM ?Increased frequency & voiding small amounts ?Denies smell, burning sensation, low back pain or pelvic pain/pressure. ?States she has an odd feeling after voiding - no pain, but not normal. ? ?AMD ?

## 2022-04-12 LAB — URINE CULTURE

## 2022-04-13 ENCOUNTER — Other Ambulatory Visit: Payer: Self-pay | Admitting: Physician Assistant

## 2022-04-13 MED ORDER — NITROFURANTOIN MONOHYD MACRO 100 MG PO CAPS: 100.0000 mg | ORAL_CAPSULE | Freq: Two times a day (BID) | ORAL | 0 refills | Status: DC

## 2022-04-28 ENCOUNTER — Ambulatory Visit: Payer: Self-pay

## 2022-04-28 DIAGNOSIS — Z0000 Encounter for general adult medical examination without abnormal findings: Secondary | ICD-10-CM

## 2022-04-28 LAB — POCT URINALYSIS DIPSTICK
Bilirubin, UA: NEGATIVE
Blood, UA: POSITIVE
Glucose, UA: NEGATIVE
Ketones, UA: NEGATIVE
Leukocytes, UA: NEGATIVE
Nitrite, UA: NEGATIVE
Protein, UA: NEGATIVE
Spec Grav, UA: >=1.03 — AB (ref 1.010–1.025)
Urobilinogen, UA: 0.2 E.U./dL
pH, UA: 6 (ref 5.0–8.0)

## 2022-04-28 NOTE — Progress Notes (Signed)
Pt presents today for physical labs, will return to clinic for scheduled physical.  

## 2022-04-29 LAB — CMP12+LP+TP+TSH+6AC+CBC/D/PLT
ALT: 28 IU/L (ref 0–32)
AST: 30 IU/L (ref 0–40)
Albumin/Globulin Ratio: 2 (ref 1.2–2.2)
Albumin: 4.9 g/dL — ABNORMAL HIGH (ref 3.8–4.8)
Alkaline Phosphatase: 104 IU/L (ref 44–121)
BUN/Creatinine Ratio: 17 (ref 12–28)
BUN: 16 mg/dL (ref 8–27)
Basophils Absolute: 0.1 10*3/uL (ref 0.0–0.2)
Basos: 1 %
Bilirubin Total: 0.5 mg/dL (ref 0.0–1.2)
Calcium: 9.7 mg/dL (ref 8.7–10.3)
Chloride: 109 mmol/L — ABNORMAL HIGH (ref 96–106)
Chol/HDL Ratio: 4.2 ratio (ref 0.0–4.4)
Cholesterol, Total: 222 mg/dL — ABNORMAL HIGH (ref 100–199)
Creatinine, Ser: 0.94 mg/dL (ref 0.57–1.00)
EOS (ABSOLUTE): 0.1 10*3/uL (ref 0.0–0.4)
Eos: 2 %
Estimated CHD Risk: 0.9 times avg. (ref 0.0–1.0)
Free Thyroxine Index: 2.3 (ref 1.2–4.9)
GGT: 18 IU/L (ref 0–60)
Globulin, Total: 2.5 g/dL (ref 1.5–4.5)
Glucose: 97 mg/dL (ref 70–99)
HDL: 53 mg/dL (ref >39)
Hematocrit: 40 % (ref 34.0–46.6)
Hemoglobin: 13.4 g/dL (ref 11.1–15.9)
Immature Grans (Abs): 0 10*3/uL (ref 0.0–0.1)
Immature Granulocytes: 0 %
Iron: 92 ug/dL (ref 27–139)
LDH: 168 IU/L (ref 119–226)
LDL Chol Calc (NIH): 150 mg/dL — ABNORMAL HIGH (ref 0–99)
Lymphocytes Absolute: 1.4 10*3/uL (ref 0.7–3.1)
Lymphs: 25 %
MCH: 30.4 pg (ref 26.6–33.0)
MCHC: 33.5 g/dL (ref 31.5–35.7)
MCV: 91 fL (ref 79–97)
Monocytes Absolute: 0.6 10*3/uL (ref 0.1–0.9)
Monocytes: 10 %
Neutrophils Absolute: 3.4 10*3/uL (ref 1.4–7.0)
Neutrophils: 62 %
Phosphorus: 3.4 mg/dL (ref 3.0–4.3)
Platelets: 295 10*3/uL (ref 150–450)
Potassium: 4.4 mmol/L (ref 3.5–5.2)
RBC: 4.41 x10E6/uL (ref 3.77–5.28)
RDW: 12.6 % (ref 11.7–15.4)
Sodium: 146 mmol/L — ABNORMAL HIGH (ref 134–144)
T3 Uptake Ratio: 28 % (ref 24–39)
T4, Total: 8.2 ug/dL (ref 4.5–12.0)
TSH: 2.86 u[IU]/mL (ref 0.450–4.500)
Total Protein: 7.4 g/dL (ref 6.0–8.5)
Triglycerides: 109 mg/dL (ref 0–149)
Uric Acid: 5.6 mg/dL (ref 3.0–7.2)
VLDL Cholesterol Cal: 19 mg/dL (ref 5–40)
WBC: 5.5 10*3/uL (ref 3.4–10.8)
eGFR: 69 mL/min/{1.73_m2} (ref >59)

## 2022-05-06 ENCOUNTER — Encounter: Payer: Self-pay | Admitting: Physician Assistant

## 2022-05-06 ENCOUNTER — Ambulatory Visit: Payer: Self-pay | Admitting: Physician Assistant

## 2022-05-06 VITALS — BP 111/72 | HR 69 | Temp 97.6°F | Resp 14 | Ht 67.0 in | Wt 152.0 lb

## 2022-05-06 DIAGNOSIS — Z0000 Encounter for general adult medical examination without abnormal findings: Secondary | ICD-10-CM

## 2022-05-06 MED ORDER — MELOXICAM 15 MG PO TABS: 15.0000 mg | ORAL_TABLET | Freq: Every day | ORAL | 2 refills | Status: DC

## 2022-05-06 NOTE — Progress Notes (Addendum)
____________________________________________   None    (approximate)  I have reviewed the triage vital signs and the nursing notes.   HISTORY  Chief Complaint Annual Exam   HPI Virginia Galvan is a 61 y.o. female patient presents for annual physical exam.  Patient is requesting a 90-day supply her meloxicam for arthritis.  Patient states she is retiring June 2023 but will continue her care at this facility.         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 Authorizing Provider  ALPRAZolam (XANAX) 0.25 MG tablet Take 1 tablet (0.25 mg total) by mouth 2 (two) times daily as needed for anxiety. 02/11/22  Yes Sable Feil, PA-C  augmented betamethasone dipropionate (DIPROLENE-AF) 0.05 % ointment Apply topically. 01/01/21  Yes [provider]  calcipotriene-betamethasone (TACLONEX) ointment Apply to affected area topically daily. Not use on face, armpit, groin. Not use longer than 4 weeks. 07/28/20  Yes Earleen Newport, MD  Cholecalciferol (VITAMIN D3 PO) Take by mouth.   Yes [provider]  clobetasol ointment (TEMOVATE) 4.68 % Apply 1 application topically every morning.   Yes [provider]  Fluocinolone Acetonide 0.01 % OIL Place 5 drops in ear(s) as needed. Use no more than 5 days 08/08/19   Yes Towanda Malkin, MD  hydrocortisone butyrate (LUCOID) 0.1 % CREA cream Apply topically. 11/17/20  Yes [provider]  meloxicam (MOBIC) 15 MG tablet Take 1 tablet (15 mg total) by mouth daily. 12/14/21  Yes Sable Feil, PA-C  Multiple Vitamin (MULTI-VITAMIN) tablet Take by mouth.   Yes [provider]  nitrofurantoin, macrocrystal-monohydrate, (MACROBID) 100 MG capsule Take 1 capsule (100 mg total) by mouth 2 (two) times daily. 04/13/22  Yes Sable Feil, PA-C  triamcinolone cream (KENALOG) 0.1 % Apply 1 application topically 2 (two) times daily. Use sparingly for areas under the armpit region 08/08/19  Yes Towanda Malkin, MD  vitamin B-12 (CYANOCOBALAMIN) 1000 MCG tablet Take by mouth.   Yes [provider]    Allergies Patient has no known allergies.  Family History  Problem Relation Age of Onset   Breast cancer Maternal Grandmother    Asthma Mother    COPD Mother    Kidney disease Mother    Heart attack Father     Social History Social History   Tobacco Use   Smoking status: Light Smoker   Smokeless tobacco: Never  Vaping Use   Vaping Use: Never used  Substance Use Topics   Alcohol use: Yes    Comment: Occ   Drug use: No    Review of Systems Constitutional: No fever/chills Eyes: No visual changes. ENT: No sore throat. Cardiovascular: Denies chest pain. Respiratory: Denies shortness of breath. Gastrointestinal: No abdominal pain.  No  nausea, no vomiting.  No diarrhea.  No constipation. Genitourinary: Negative for dysuria. Musculoskeletal: Negative for back pain. Skin: Negative for rash. Neurological: Negative for headaches, focal weakness or numbness. Psychiatric: Anxiety  ____________________________________________   PHYSICAL EXAM:  VITAL SIGNS: BP is 111/72, pulse 69, respiration 14, temperature 97.6, patient 1% O2 sat on room air.  Patient weighs 152 pounds and BMI is 23.81. Constitutional: Alert and  oriented. Well appearing and in no acute distress. Eyes: Conjunctivae are normal. PERRL. EOMI. Head: Atraumatic. Nose: No congestion/rhinnorhea. Mouth/Throat: Mucous membranes are moist.  Oropharynx non-erythematous. Neck: No stridor.  No cervical spine tenderness to palpation. Hematological/Lymphatic/Immunilogical: No cervical lymphadenopathy. Cardiovascular: Normal rate, regular rhythm. Grossly normal heart sounds.  Good peripheral circulation. Respiratory: Normal respiratory effort.  No retractions. Lungs CTAB. Gastrointestinal: Soft and nontender. No distention. No abdominal bruits. No CVA tenderness. Genitourinary: Deferred Musculoskeletal: No lower extremity tenderness nor edema.  No joint effusions. Neurologic:  Normal speech and language. No gross focal neurologic deficits are appreciated. No gait instability. Skin:  Skin is warm, dry and intact. No rash noted. Psychiatric: Mood and affect are normal. Speech and behavior are normal.  ____________________________________________   LABS        Component Ref Range & Units 8 d ago 4 wk ago 1 yr ago 2 yr ago  Color, UA  Yellow  Light Yellow  yellow  Yellow   Clarity, UA  Clear  Clear  clear  Clear   Glucose, UA Negative Negative  Negative  Negative  Negative   Bilirubin, UA  Negative  Negative  negative  Negative   Ketones, UA  Negative  Negative  negative  Negative   Spec Grav, UA 1.010 - 1.025 >=1.030 Abnormal   1.015  1.015  1.025   Blood, UA  Positive  Negative  negative  Negative   Comment: 1+  pH, UA 5.0 - 8.0 6.0  6.0  6.0  5.5   Protein, UA Negative Negative  Negative  Negative  Negative   Urobilinogen, UA 0.2 or 1.0 E.U./dL 0.2  0.2  0.2  0.2   Nitrite, UA  Negative  Negative  negative  Negative   Leukocytes, UA Negative Negative  Negative  Negative  Small (1+) Abnormal    Appearance     light     Odor                          Other Results from 04/28/2022   Contains abnormal data  CMP12+LP+TP+TSH+6AC+CBC/D/Plt Order: [id number] Status: Final result    Visible to patient: Yes (seen)    Next appt: None    Dx: Routine adult health maintenance    0 Result Notes       Component Ref Range & Units 8 d ago 1 yr ago 2 yr ago  Glucose 70 - 99 mg/dL 97  85 R  92 R   Uric Acid 3.0 - 7.2 mg/dL 5.6  5.2 CM  5.4 CM   Comment:            Therapeutic target for gout patients: <6.0  BUN 8 - 27 mg/dL 16  12  17  R   Creatinine, Ser 0.57 - 1.00 mg/dL 0.94  1.00  0.93   eGFR >59 mL/min/1.73 69  64    BUN/Creatinine Ratio 12 - 28 17  12  18  R   Sodium 134 - 144 mmol/L 146 High   141  141   Potassium 3.5 -  5.2 mmol/L 4.4  5.1  4.4   Chloride 96 - 106 mmol/L 109 High   104  106   Calcium 8.7 - 10.3 mg/dL 9.7  10.0  9.8 R   Phosphorus 3.0 - 4.3 mg/dL 3.4  3.7  3.7   Total Protein 6.0 - 8.5 g/dL 7.4  7.0  6.8   Albumin 3.8 - 4.8 g/dL 4.9 High   4.6 R  4.6 R   Globulin, Total 1.5 - 4.5 g/dL 2.5  2.4  2.2   Albumin/Globulin Ratio 1.2 - 2.2 2.0  1.9  2.1   Bilirubin Total 0.0 - 1.2 mg/dL 0.5  0.4  0.3   Alkaline Phosphatase 44 - 121 IU/L 104  106  96 R   LDH 119 - 226 IU/L 168  158  157   AST 0 - 40 IU/L 30  20  25    ALT 0 - 32 IU/L 28  13  22    GGT 0 - 60 IU/L 18  16  18    Iron 27 - 139 ug/dL 92  106 R  88 R   Cholesterol, Total 100 - 199 mg/dL 222 High   205 High   210 High    Triglycerides 0 - 149 mg/dL 109  137  120   HDL >39 mg/dL 53  48  46   VLDL Cholesterol Cal 5 - 40 mg/dL 19  25  22    LDL Chol Calc (NIH) 0 - 99 mg/dL 150 High   132 High   142 High    Chol/HDL Ratio 0.0 - 4.4 ratio 4.2  4.3 CM  4.6 High  CM   Comment:                                   T. Chol/HDL Ratio                                              Men  Women                                1/2 Avg.Risk  3.4    3.3                                    Avg.Risk  5.0    4.4                                 2X Avg.Risk  9.6    7.1                                 3X Avg.Risk 23.4   11.0   Estimated CHD Risk 0.0 -  1.0 times avg. 0.9  1.0 CM  1.1 High  CM   Comment: The CHD Risk is based on the T. Chol/HDL ratio. Other  factors affect CHD Risk such as hypertension, smoking,  diabetes, severe obesity, and family history of  premature CHD.   TSH 0.450 - 4.500 uIU/mL 2.860  2.530  2.240   T4, Total 4.5 -  12.0 ug/dL 8.2  6.6  6.2   T3 Uptake Ratio 24 - 39 % 28  24  27    Free Thyroxine Index 1.2 - 4.9 2.3  1.6  1.7   WBC 3.4 - 10.8 x10E3/uL 5.5  5.7  5.4   RBC 3.77 - 5.28 x10E6/uL 4.41  4.55  4.32   Hemoglobin 11.1 - 15.9 g/dL 13.4  13.5  13.3   Hematocrit 34.0 - 46.6 % 40.0  40.5  39.1   MCV 79 - 97 fL 91  89  91   MCH 26.6 - 33.0 pg 30.4  29.7  30.8   MCHC 31.5 - 35.7 g/dL 33.5  33.3  34.0   RDW 11.7 - 15.4 % 12.6  12.0  12.1   Platelets 150 - 450 x10E3/uL 295  323  280   Neutrophils Not Estab. % 62  67  62   Lymphs Not Estab. % 25  22  24    Monocytes Not Estab. % 10  9  11    Eos Not Estab. % 2  1  2    Basos Not Estab. % 1  1  1    Neutrophils Absolute 1.4 - 7.0 x10E3/uL 3.4  3.8  3.3   Lymphocytes Absolute 0.7 - 3.1 x10E3/uL 1.4  1.2  1.3   Monocytes Absolute 0.1 - 0.9 x10E3/uL 0.6  0.5  0.6   EOS (ABSOLUTE) 0.0 - 0.4 x10E3/uL 0.1  0.1  0.1   Basophils Absolute 0.0 - 0.2 x10E3/uL 0.1  0.1  0.1   Immature Granulocytes Not Estab. % 0  0  0   Immature Grans          ____________________________________________  EKG Sinus  Rhythm at 68 bpm -RSR(V1) -nondiagnostic.   PROBABLY NORMAL  ____________________________________________    ____________________________________________   INITIAL IMPRESSION / ASSESSMENT AND PLAN As part of my medical decision making, I reviewed the following data within the [facility]      Discussed lab and ECG findings with patient.  Advised dietary control for elevation of her cholesterol.  Meloxicam will be prescribed for 90-day supply.        ____________________________________________   FINAL CLINICAL IMPRESSION  Well  exam   ED Discharge Orders     None        Note:  This document was prepared using Dragon voice recognition software and may include unintentional dictation errors.

## 2022-05-06 NOTE — Progress Notes (Signed)
Pt presents today to complete physical, Pt denies any issues or concerns at this time/CL,RMA 

## 2022-07-09 ENCOUNTER — Telehealth: Payer: Self-pay

## 2022-07-09 DIAGNOSIS — Z1231 Encounter for screening mammogram for malignant neoplasm of breast: Secondary | ICD-10-CM

## 2022-07-09 NOTE — Telephone Encounter (Signed)
Virginia Galvan called [facility] requesting order for a mammogram.  Completed annual physical 05/06/2022 with Nona Dell, PA-C.  AMD

## 2022-10-11 ENCOUNTER — Ambulatory Visit
Admission: RE | Admit: 2022-10-11 | Discharge: 2022-10-11 | Disposition: A | Discharge status: Home / Self Care | Payer: 59 | Source: Ambulatory Visit | Attending: Physician Assistant | Admitting: Physician Assistant

## 2022-10-11 DIAGNOSIS — Z1231 Encounter for screening mammogram for malignant neoplasm of breast: Secondary | ICD-10-CM | POA: Insufficient documentation

## 2022-11-08 ENCOUNTER — Other Ambulatory Visit: Payer: Self-pay

## 2022-11-08 DIAGNOSIS — M25572 Pain in left ankle and joints of left foot: Secondary | ICD-10-CM

## 2022-11-08 DIAGNOSIS — M159 Polyosteoarthritis, unspecified: Secondary | ICD-10-CM

## 2022-11-08 MED ORDER — MELOXICAM 15 MG PO TABS: 15.0000 mg | ORAL_TABLET | Freq: Every day | ORAL | 3 refills | Status: DC

## 2022-11-25 DIAGNOSIS — H5213 Myopia, bilateral: Secondary | ICD-10-CM | POA: Diagnosis not present

## 2022-11-25 DIAGNOSIS — D3131 Benign neoplasm of right choroid: Secondary | ICD-10-CM | POA: Diagnosis not present

## 2022-11-25 DIAGNOSIS — H02889 Meibomian gland dysfunction of unspecified eye, unspecified eyelid: Secondary | ICD-10-CM | POA: Diagnosis not present

## 2022-12-02 ENCOUNTER — Other Ambulatory Visit: Payer: Self-pay

## 2022-12-02 DIAGNOSIS — F411 Generalized anxiety disorder: Secondary | ICD-10-CM

## 2022-12-03 MED ORDER — ALPRAZOLAM 0.25 MG PO TABS: 0.2500 mg | ORAL_TABLET | Freq: Two times a day (BID) | ORAL | 0 refills | Status: DC | PRN

## 2023-02-15 DIAGNOSIS — F4321 Adjustment disorder with depressed mood: Secondary | ICD-10-CM | POA: Diagnosis not present

## 2023-02-15 DIAGNOSIS — M19041 Primary osteoarthritis, right hand: Secondary | ICD-10-CM | POA: Diagnosis not present

## 2023-02-15 DIAGNOSIS — L409 Psoriasis, unspecified: Secondary | ICD-10-CM | POA: Diagnosis not present

## 2023-02-16 DIAGNOSIS — Z0000 Encounter for general adult medical examination without abnormal findings: Secondary | ICD-10-CM | POA: Diagnosis not present

## 2023-02-16 DIAGNOSIS — Z7689 Persons encountering health services in other specified circumstances: Secondary | ICD-10-CM | POA: Diagnosis not present

## 2023-02-16 DIAGNOSIS — M19041 Primary osteoarthritis, right hand: Secondary | ICD-10-CM | POA: Diagnosis not present

## 2023-02-18 DIAGNOSIS — D2272 Melanocytic nevi of left lower limb, including hip: Secondary | ICD-10-CM | POA: Diagnosis not present

## 2023-02-18 DIAGNOSIS — D2262 Melanocytic nevi of left upper limb, including shoulder: Secondary | ICD-10-CM | POA: Diagnosis not present

## 2023-02-18 DIAGNOSIS — D2261 Melanocytic nevi of right upper limb, including shoulder: Secondary | ICD-10-CM | POA: Diagnosis not present

## 2023-02-18 DIAGNOSIS — L821 Other seborrheic keratosis: Secondary | ICD-10-CM | POA: Diagnosis not present

## 2023-02-18 DIAGNOSIS — L400 Psoriasis vulgaris: Secondary | ICD-10-CM | POA: Diagnosis not present

## 2023-02-18 DIAGNOSIS — D225 Melanocytic nevi of trunk: Secondary | ICD-10-CM | POA: Diagnosis not present

## 2023-02-18 DIAGNOSIS — D2271 Melanocytic nevi of right lower limb, including hip: Secondary | ICD-10-CM | POA: Diagnosis not present

## 2023-04-19 IMAGING — MG MM DIGITAL SCREENING BILAT W/ TOMO AND CAD
8 series · 9 of 24 positions shown · non-contrast
Comparison: Previous exam(s).

CLINICAL DATA: Screening.

EXAM:
DIGITAL SCREENING BILATERAL MAMMOGRAM WITH TOMOSYNTHESIS AND CAD
TECHNIQUE: Bilateral screening digital craniocaudal and mediolateral oblique
mammograms were obtained. Bilateral screening digital breast
tomosynthesis was performed. The images were evaluated with
computer-aided detection.

[R MLO synth-2D]
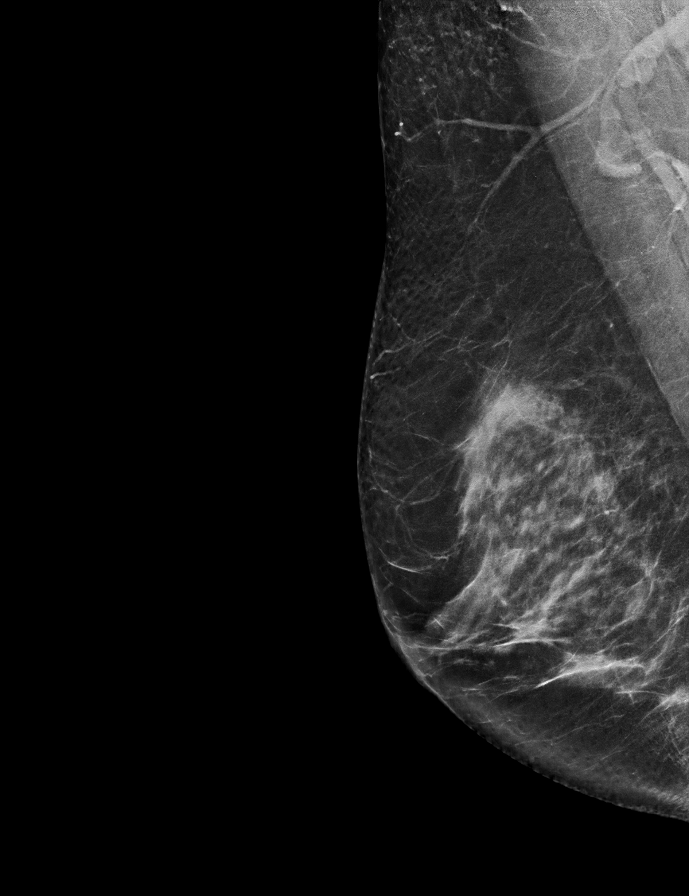

[R CC synth-2D]
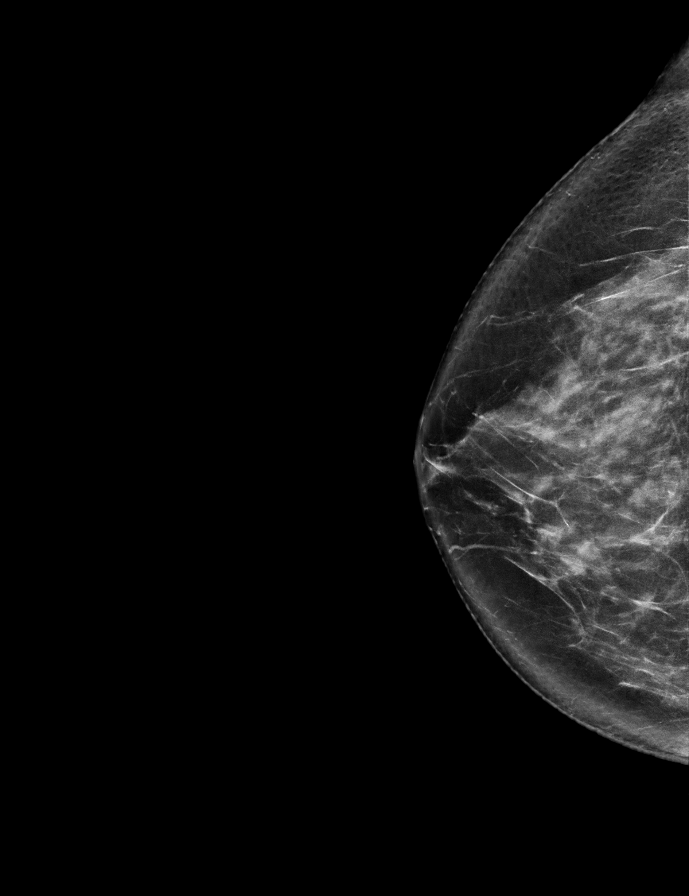

[L CC synth-2D]
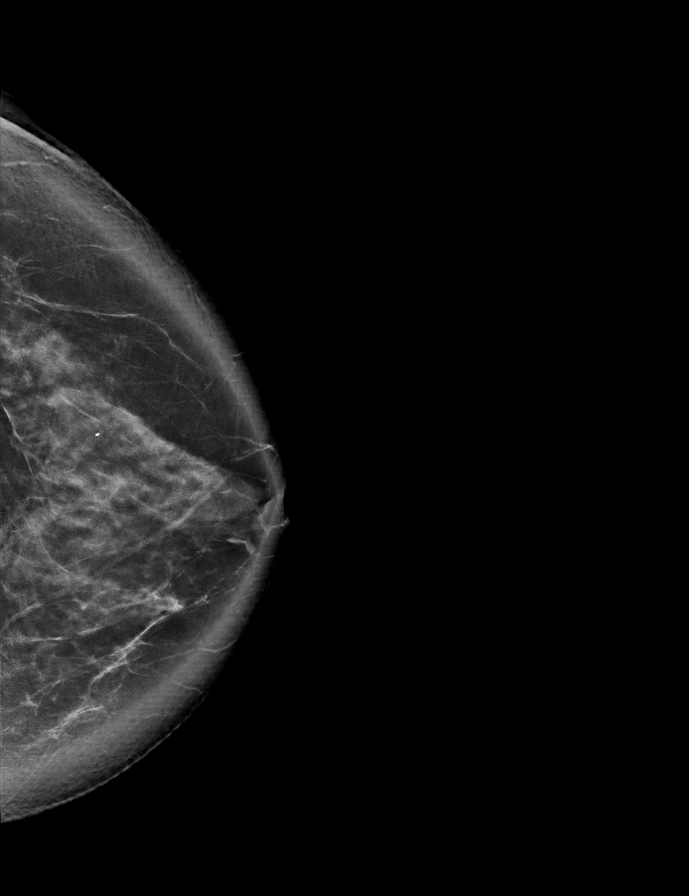

[L MLO synth-2D]
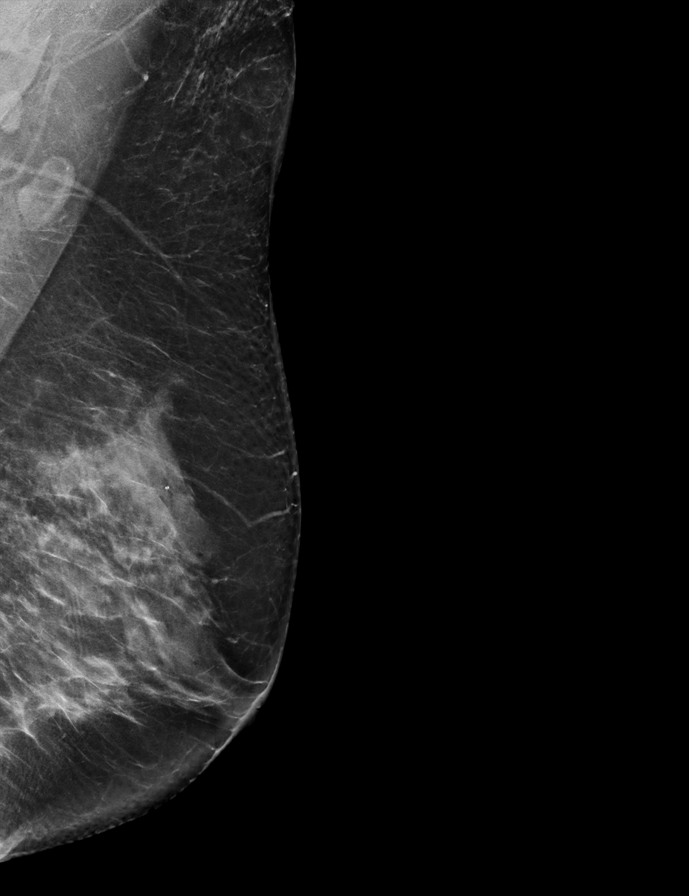

[L MLO tomo · 2 of 77 frames shown]
[frame 25/77]
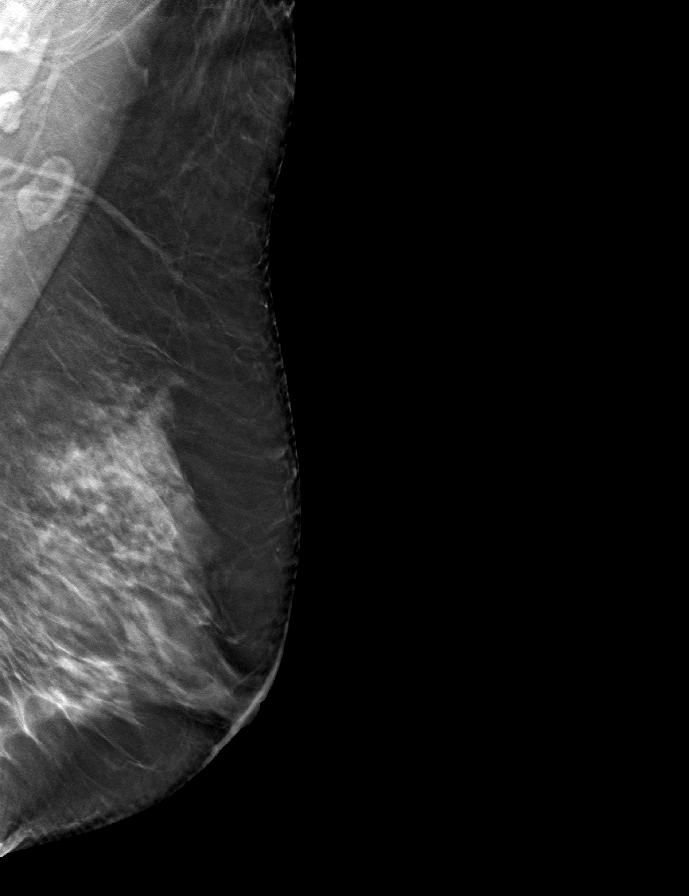
[frame 39/77]
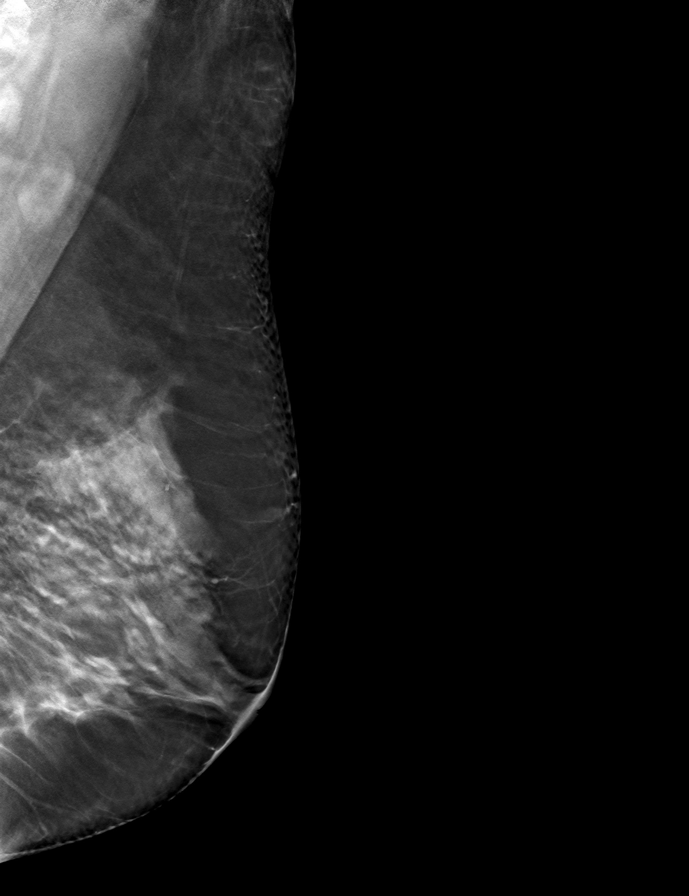

[R CC tomo · tomo slice 38/75.0]
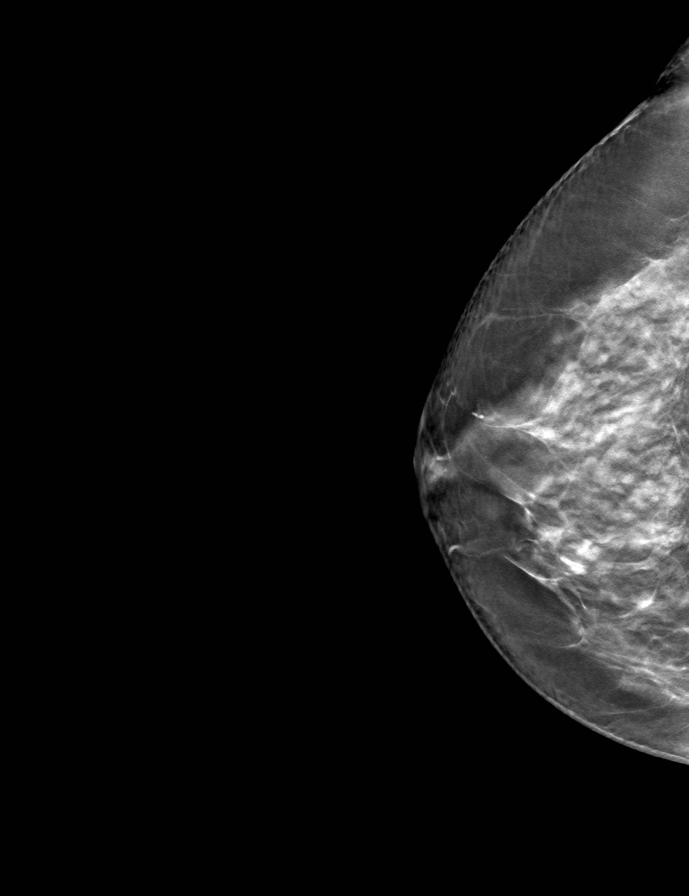

[L CC tomo · tomo slice 59/117.0]
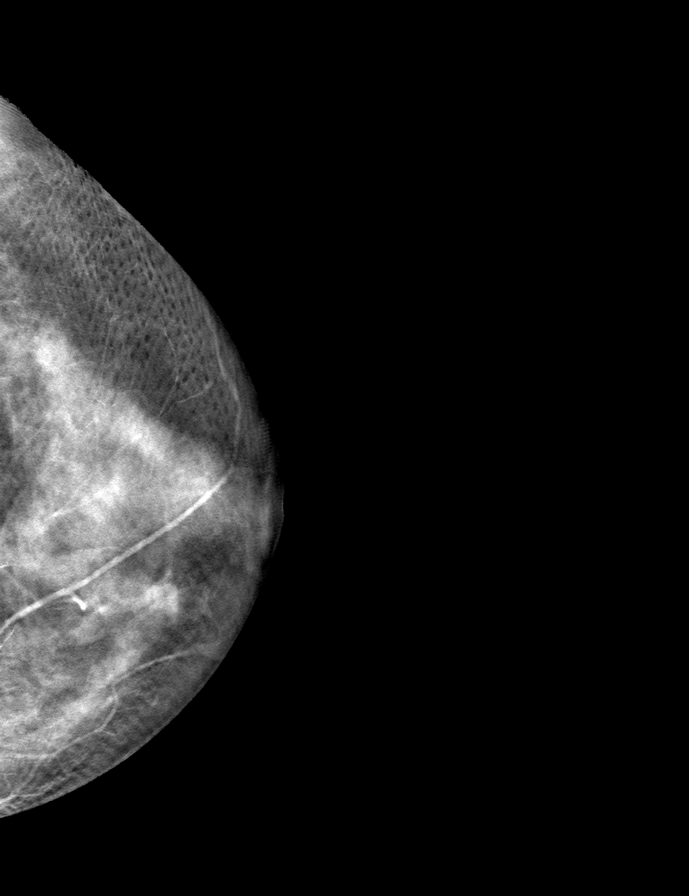

[R MLO tomo · tomo slice 37/74.0]
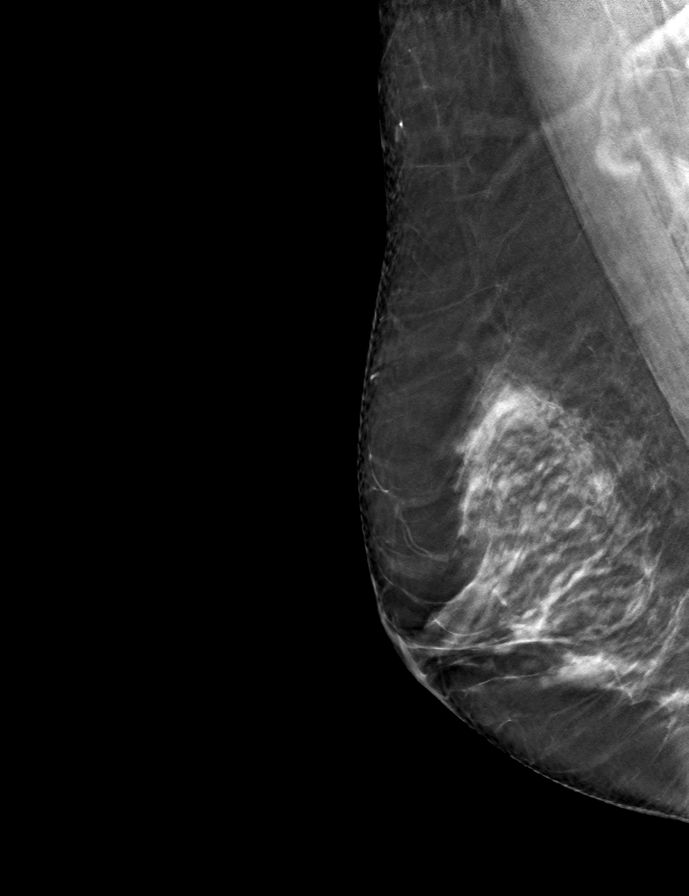

[9 of 24 positions shown; findings below may reference images not displayed]

ACR Breast Density Category c: The breast tissue is heterogeneously
dense, which may obscure small masses.
FINDINGS: There are no findings suspicious for malignancy.
IMPRESSION: No mammographic evidence of malignancy. A result letter of this
screening mammogram will be mailed directly to the patient.

RECOMMENDATION:
Screening mammogram in one year. (Code:Q3-W-BC3)

BI-RADS CATEGORY  1: Negative.

## 2023-05-06 DIAGNOSIS — Z8249 Family history of ischemic heart disease and other diseases of the circulatory system: Secondary | ICD-10-CM | POA: Diagnosis not present

## 2023-05-06 DIAGNOSIS — Z87891 Personal history of nicotine dependence: Secondary | ICD-10-CM | POA: Diagnosis not present

## 2023-05-06 DIAGNOSIS — Z823 Family history of stroke: Secondary | ICD-10-CM | POA: Diagnosis not present

## 2023-05-06 DIAGNOSIS — M1990 Unspecified osteoarthritis, unspecified site: Secondary | ICD-10-CM | POA: Diagnosis not present

## 2023-05-06 DIAGNOSIS — L400 Psoriasis vulgaris: Secondary | ICD-10-CM | POA: Diagnosis not present

## 2023-05-06 DIAGNOSIS — Z833 Family history of diabetes mellitus: Secondary | ICD-10-CM | POA: Diagnosis not present

## 2023-05-06 DIAGNOSIS — I10 Essential (primary) hypertension: Secondary | ICD-10-CM | POA: Diagnosis not present

## 2023-05-06 DIAGNOSIS — Z791 Long term (current) use of non-steroidal anti-inflammatories (NSAID): Secondary | ICD-10-CM | POA: Diagnosis not present

## 2023-05-06 DIAGNOSIS — Z803 Family history of malignant neoplasm of breast: Secondary | ICD-10-CM | POA: Diagnosis not present

## 2023-05-22 ENCOUNTER — Other Ambulatory Visit: Payer: Self-pay | Admitting: Physician Assistant

## 2023-05-22 DIAGNOSIS — M159 Polyosteoarthritis, unspecified: Secondary | ICD-10-CM

## 2023-06-29 ENCOUNTER — Encounter: Payer: Self-pay | Admitting: Intensive Care

## 2023-06-29 ENCOUNTER — Inpatient Hospital Stay
Admission: EM | Admit: 2023-06-29 | Discharge: 2023-07-02 | DRG: 872 | Disposition: A | Discharge status: Home / Self Care | Payer: 59 | Attending: Internal Medicine | Admitting: Internal Medicine

## 2023-06-29 ENCOUNTER — Emergency Department: Payer: 59

## 2023-06-29 ENCOUNTER — Other Ambulatory Visit: Payer: Self-pay

## 2023-06-29 DIAGNOSIS — Z791 Long term (current) use of non-steroidal anti-inflammatories (NSAID): Secondary | ICD-10-CM | POA: Diagnosis not present

## 2023-06-29 DIAGNOSIS — E871 Hypo-osmolality and hyponatremia: Secondary | ICD-10-CM | POA: Diagnosis present

## 2023-06-29 DIAGNOSIS — E876 Hypokalemia: Secondary | ICD-10-CM | POA: Diagnosis present

## 2023-06-29 DIAGNOSIS — N390 Urinary tract infection, site not specified: Secondary | ICD-10-CM | POA: Diagnosis present

## 2023-06-29 DIAGNOSIS — Z8249 Family history of ischemic heart disease and other diseases of the circulatory system: Secondary | ICD-10-CM | POA: Diagnosis not present

## 2023-06-29 DIAGNOSIS — N12 Tubulo-interstitial nephritis, not specified as acute or chronic: Secondary | ICD-10-CM | POA: Diagnosis present

## 2023-06-29 DIAGNOSIS — F419 Anxiety disorder, unspecified: Secondary | ICD-10-CM | POA: Insufficient documentation

## 2023-06-29 DIAGNOSIS — N179 Acute kidney failure, unspecified: Secondary | ICD-10-CM | POA: Diagnosis present

## 2023-06-29 DIAGNOSIS — L409 Psoriasis, unspecified: Secondary | ICD-10-CM | POA: Diagnosis present

## 2023-06-29 DIAGNOSIS — B9620 Unspecified Escherichia coli [E. coli] as the cause of diseases classified elsewhere: Secondary | ICD-10-CM | POA: Diagnosis present

## 2023-06-29 DIAGNOSIS — R6520 Severe sepsis without septic shock: Secondary | ICD-10-CM | POA: Diagnosis present

## 2023-06-29 DIAGNOSIS — Z841 Family history of disorders of kidney and ureter: Secondary | ICD-10-CM | POA: Diagnosis not present

## 2023-06-29 DIAGNOSIS — Z1152 Encounter for screening for COVID-19: Secondary | ICD-10-CM | POA: Diagnosis not present

## 2023-06-29 DIAGNOSIS — E860 Dehydration: Secondary | ICD-10-CM | POA: Diagnosis present

## 2023-06-29 DIAGNOSIS — E861 Hypovolemia: Secondary | ICD-10-CM | POA: Diagnosis present

## 2023-06-29 DIAGNOSIS — Z87891 Personal history of nicotine dependence: Secondary | ICD-10-CM | POA: Diagnosis not present

## 2023-06-29 DIAGNOSIS — A419 Sepsis, unspecified organism: Secondary | ICD-10-CM

## 2023-06-29 DIAGNOSIS — A4150 Gram-negative sepsis, unspecified: Principal | ICD-10-CM | POA: Diagnosis present

## 2023-06-29 DIAGNOSIS — Z825 Family history of asthma and other chronic lower respiratory diseases: Secondary | ICD-10-CM

## 2023-06-29 DIAGNOSIS — N3000 Acute cystitis without hematuria: Secondary | ICD-10-CM | POA: Diagnosis present

## 2023-06-29 HISTORY — DX: Unspecified osteoarthritis, unspecified site: M19.90

## 2023-06-29 LAB — URINALYSIS, ROUTINE W REFLEX MICROSCOPIC
Bilirubin Urine: NEGATIVE
Glucose, UA: NEGATIVE mg/dL
Ketones, ur: NEGATIVE mg/dL
Nitrite: NEGATIVE
Protein, ur: >=300 mg/dL — AB
Specific Gravity, Urine: 1.015 (ref 1.005–1.030)
WBC, UA: >50 WBC/hpf (ref 0–5)
pH: 5 (ref 5.0–8.0)

## 2023-06-29 LAB — CBC WITH DIFFERENTIAL/PLATELET
Abs Immature Granulocytes: 0.13 10*3/uL — ABNORMAL HIGH (ref 0.00–0.07)
Basophils Absolute: 0.1 10*3/uL (ref 0.0–0.1)
Basophils Relative: 1 %
Eosinophils Absolute: 0.1 10*3/uL (ref 0.0–0.5)
Eosinophils Relative: 1 %
HCT: 37.3 % (ref 36.0–46.0)
Hemoglobin: 12.5 g/dL (ref 12.0–15.0)
Immature Granulocytes: 1 %
Lymphocytes Relative: 3 %
Lymphs Abs: 0.6 10*3/uL — ABNORMAL LOW (ref 0.7–4.0)
MCH: 31.2 pg (ref 26.0–34.0)
MCHC: 33.5 g/dL (ref 30.0–36.0)
MCV: 93 fL (ref 80.0–100.0)
Monocytes Absolute: 2 10*3/uL — ABNORMAL HIGH (ref 0.1–1.0)
Monocytes Relative: 11 %
Neutro Abs: 15 10*3/uL — ABNORMAL HIGH (ref 1.7–7.7)
Neutrophils Relative %: 83 %
Platelets: 249 10*3/uL (ref 150–400)
RBC: 4.01 MIL/uL (ref 3.87–5.11)
RDW: 13.4 % (ref 11.5–15.5)
WBC: 17.9 10*3/uL — ABNORMAL HIGH (ref 4.0–10.5)
nRBC: 0 % (ref 0.0–0.2)

## 2023-06-29 LAB — LACTIC ACID, PLASMA
Lactic Acid, Venous: 4 mmol/L (ref 0.5–1.9)
Lactic Acid, Venous: 1.5 mmol/L (ref 0.5–1.9)

## 2023-06-29 LAB — COMPREHENSIVE METABOLIC PANEL
ALT: 28 U/L (ref 0–44)
AST: 35 U/L (ref 15–41)
Albumin: 3.1 g/dL — ABNORMAL LOW (ref 3.5–5.0)
Alkaline Phosphatase: 235 U/L — ABNORMAL HIGH (ref 38–126)
Anion gap: 14 (ref 5–15)
BUN: 38 mg/dL — ABNORMAL HIGH (ref 8–23)
CO2: 20 mmol/L — ABNORMAL LOW (ref 22–32)
Calcium: 8.6 mg/dL — ABNORMAL LOW (ref 8.9–10.3)
Chloride: 95 mmol/L — ABNORMAL LOW (ref 98–111)
Creatinine, Ser: 2.92 mg/dL — ABNORMAL HIGH (ref 0.44–1.00)
GFR, Estimated: 17 mL/min — ABNORMAL LOW (ref >60)
Glucose, Bld: 137 mg/dL — ABNORMAL HIGH (ref 70–99)
Potassium: 4.1 mmol/L (ref 3.5–5.1)
Sodium: 129 mmol/L — ABNORMAL LOW (ref 135–145)
Total Bilirubin: 1.3 mg/dL — ABNORMAL HIGH (ref 0.3–1.2)
Total Protein: 7.5 g/dL (ref 6.5–8.1)

## 2023-06-29 LAB — RESP PANEL BY RT-PCR (RSV, FLU A&B, COVID)  RVPGX2
Influenza A by PCR: NEGATIVE
Influenza B by PCR: NEGATIVE
Resp Syncytial Virus by PCR: NEGATIVE
SARS Coronavirus 2 by RT PCR: NEGATIVE

## 2023-06-29 MED ORDER — CALCIPOTRIENE-BETAMETH DIPROP 0.005-0.064 % EX OINT: TOPICAL_OINTMENT | Freq: Every day | CUTANEOUS | Status: DC

## 2023-06-29 MED ORDER — SODIUM CHLORIDE 0.9 % IV SOLN
150.0000 mL/h | INTRAVENOUS | Status: DC
  Administered 2023-06-29 – 2023-06-30 (×3): 150 mL/h via INTRAVENOUS

## 2023-06-29 MED ORDER — ENOXAPARIN SODIUM 30 MG/0.3ML IJ SOSY
30.0000 mg | PREFILLED_SYRINGE | INTRAMUSCULAR | Status: DC
  Administered 2023-06-29 – 2023-07-01 (×2): 30 mg via SUBCUTANEOUS
  Filled 2023-06-29 (×3): qty 0.3

## 2023-06-29 MED ORDER — SODIUM CHLORIDE 0.9 % IV BOLUS
1250.0000 mL | Freq: Once | INTRAVENOUS | Status: AC
  Administered 2023-06-29: 1250 mL via INTRAVENOUS

## 2023-06-29 MED ORDER — ACETAMINOPHEN 325 MG PO TABS
650.0000 mg | ORAL_TABLET | Freq: Four times a day (QID) | ORAL | Status: DC | PRN
  Administered 2023-06-29 – 2023-07-01 (×4): 650 mg via ORAL
  Filled 2023-06-29 (×4): qty 2

## 2023-06-29 MED ORDER — ONDANSETRON HCL 4 MG PO TABS: 4.0000 mg | ORAL_TABLET | Freq: Four times a day (QID) | ORAL | Status: DC | PRN

## 2023-06-29 MED ORDER — VITAMIN B-12 1000 MCG PO TABS
1000.0000 ug | ORAL_TABLET | Freq: Every day | ORAL | Status: DC
  Administered 2023-06-30 – 2023-07-02 (×3): 1000 ug via ORAL
  Filled 2023-06-29 (×3): qty 1

## 2023-06-29 MED ORDER — ALPRAZOLAM 0.25 MG PO TABS: 0.2500 mg | ORAL_TABLET | Freq: Two times a day (BID) | ORAL | Status: DC | PRN

## 2023-06-29 MED ORDER — CLOBETASOL PROPIONATE 0.05 % EX OINT: 1.0000 | TOPICAL_OINTMENT | Freq: Every day | CUTANEOUS | Status: DC | PRN

## 2023-06-29 MED ORDER — VITAMIN D 25 MCG (1000 UNIT) PO TABS
1000.0000 [IU] | ORAL_TABLET | Freq: Every day | ORAL | Status: DC
  Administered 2023-06-30 – 2023-07-02 (×3): 1000 [IU] via ORAL
  Filled 2023-06-29 (×3): qty 1

## 2023-06-29 MED ORDER — ONDANSETRON HCL 4 MG/2ML IJ SOLN: 4.0000 mg | Freq: Four times a day (QID) | INTRAMUSCULAR | Status: DC | PRN

## 2023-06-29 MED ORDER — SODIUM CHLORIDE 0.9 % IV BOLUS (SEPSIS)
1000.0000 mL | Freq: Once | INTRAVENOUS | Status: AC
  Administered 2023-06-29: 1000 mL via INTRAVENOUS

## 2023-06-29 MED ORDER — ZINC SULFATE 220 (50 ZN) MG PO CAPS
220.0000 mg | ORAL_CAPSULE | Freq: Every day | ORAL | Status: DC
  Administered 2023-06-30 – 2023-07-02 (×3): 220 mg via ORAL
  Filled 2023-06-29 (×3): qty 1

## 2023-06-29 MED ORDER — SODIUM CHLORIDE 0.9 % IV SOLN: 2.0000 g | INTRAVENOUS | Status: DC

## 2023-06-29 MED ORDER — ACETAMINOPHEN 650 MG RE SUPP: 650.0000 mg | Freq: Four times a day (QID) | RECTAL | Status: DC | PRN

## 2023-06-29 MED ORDER — TRIAMCINOLONE ACETONIDE 0.1 % EX CREA: 1.0000 | TOPICAL_CREAM | Freq: Two times a day (BID) | CUTANEOUS | Status: DC | PRN

## 2023-06-29 MED ORDER — TRAZODONE HCL 50 MG PO TABS: 25.0000 mg | ORAL_TABLET | Freq: Every evening | ORAL | Status: DC | PRN

## 2023-06-29 MED ORDER — MAGNESIUM HYDROXIDE 400 MG/5ML PO SUSP: 30.0000 mL | Freq: Every day | ORAL | Status: DC | PRN

## 2023-06-29 MED ORDER — SODIUM CHLORIDE 0.9 % IV SOLN
2.0000 g | INTRAVENOUS | Status: DC
  Administered 2023-06-29 – 2023-07-02 (×4): 2 g via INTRAVENOUS
  Filled 2023-06-29 (×4): qty 20

## 2023-06-29 NOTE — ED Notes (Signed)
Post Void bladder scan shows 5 ml residual

## 2023-06-29 NOTE — Assessment & Plan Note (Addendum)
-   This is likely prerenal due to hypovolemia and mild dehydration. - The patient will be hydrated with IV normal saline and will follow her BMP. - We will hold off her meloxicam and avoid nephrotoxins.

## 2023-06-29 NOTE — ED Provider Notes (Signed)
[facility] Provider Note    Event Date/Time   First MD Initiated Contact with Patient 06/29/23 1805     (approximate)   History   Fever and Dehydration   HPI  Virginia Galvan is a 62 y.o. female presenting to the emergency department for evaluation of chills.  On Friday, patient began to develop subjective fever and chills.  She had noted dysuria about a week ago, but reports that this has resolved.  Denies urinary urgency, frequency, difficulty emptying her bladder.  She does report that she has been on vacation recently and has not been drinking as much water as usual, is concerned that she may be dehydrated.  Does report a headache described as pressure over her sinuses, not sudden in onset or different from prior.  Reports mild cough, no shortness of breath, chest pain, vomiting, diarrhea, abdominal pain, flank pain.      Physical Exam   Triage Vital Signs: ED Triage Vitals  Encounter Vitals Group     BP 06/29/23 1651 (!) 100/51     Systolic BP Percentile --      Diastolic BP Percentile --      Pulse Rate 06/29/23 1651 (!) 110     Resp 06/29/23 1651 18     Temp 06/29/23 1651 99.3 F (37.4 C)     Temp Source 06/29/23 1651 Oral     SpO2 06/29/23 1651 100 %     Weight 06/29/23 1654 150 lb (68 kg)     Height 06/29/23 1654 5\' 7"  (1.702 m)     Head Circumference --      Peak Flow --      Pain Score 06/29/23 1653 1     Pain Loc --      Pain Education --      Exclude from Growth Chart --     Most recent vital signs: Vitals:   06/29/23 1934 06/29/23 2154  BP:  (!) 141/78  Pulse:  (!) 104  Resp:  20  Temp: 98.3 F (36.8 C) 98 F (36.7 C)  SpO2:  100%     General: Awake, interactive  CV:  Mild tachycardia with regular rhythm, good peripheral perfusion Resp:  Lungs clear, unlabored respirations.  Abd:  Soft, nondistended.,  Nontender to palpation throughout the abdomen, no CVA tenderness Neuro:  Symmetric facial movement, fluid  speech   ED Results / Procedures / Treatments   Labs (all labs ordered are listed, but only abnormal results are displayed) Labs Reviewed  URINALYSIS, ROUTINE W REFLEX MICROSCOPIC - Abnormal; Notable for the following components:      Result Value   Color, Urine AMBER (*)    APPearance TURBID (*)    Hgb urine dipstick SMALL (*)    Protein, ur >=300 (*)    Leukocytes,Ua LARGE (*)    Bacteria, UA MANY (*)    All other components within normal limits  CBC WITH DIFFERENTIAL/PLATELET - Abnormal; Notable for the following components:   WBC 17.9 (*)    Neutro Abs 15.0 (*)    Lymphs Abs 0.6 (*)    Monocytes Absolute 2.0 (*)    Abs Immature Granulocytes 0.13 (*)    All other components within normal limits  COMPREHENSIVE METABOLIC PANEL - Abnormal; Notable for the following components:   Sodium 129 (*)    Chloride 95 (*)    CO2 20 (*)    Glucose, Bld 137 (*)    BUN 38 (*)  Creatinine, Ser 2.92 (*)    Calcium 8.6 (*)    Albumin 3.1 (*)    Alkaline Phosphatase 235 (*)    Total Bilirubin 1.3 (*)    GFR, Estimated 17 (*)    All other components within normal limits  LACTIC ACID, PLASMA - Abnormal; Notable for the following components:   Lactic Acid, Venous 4.0 (*)    All other components within normal limits  RESP PANEL BY RT-PCR (RSV, FLU A&B, COVID)  RVPGX2  CULTURE, BLOOD (ROUTINE X 2)  CULTURE, BLOOD (ROUTINE X 2)  URINE CULTURE  LACTIC ACID, PLASMA  HIV ANTIBODY (ROUTINE TESTING W REFLEX)  PROTIME-INR  CORTISOL-AM, BLOOD  PROCALCITONIN  BASIC METABOLIC PANEL  CBC     EKG EKG independently reviewed interpreted by myself (ER attending) demonstrates:    RADIOLOGY Imaging independently reviewed and interpreted by myself demonstrates:  CXR without focal consolidation  PROCEDURES:  Critical Care performed: Yes, see critical care procedure note(s)  CRITICAL CARE Performed by: Trinna Post   Total critical care time: 32 minutes  Critical care time was exclusive  of separately billable procedures and treating other patients.  Critical care was necessary to treat or prevent imminent or life-threatening deterioration.  Critical care was time spent personally by me on the following activities: development of treatment plan with patient and/or surrogate as well as nursing, discussions with consultants, evaluation of patient's response to treatment, examination of patient, obtaining history from patient or surrogate, ordering and performing treatments and interventions, ordering and review of laboratory studies, ordering and review of radiographic studies, pulse oximetry and re-evaluation of patient's condition.   Procedures   MEDICATIONS ORDERED IN ED: Medications  cefTRIAXone (ROCEPHIN) 2 g in sodium chloride 0.9 % 100 mL IVPB (0 g Intravenous Stopped 06/29/23 1934)  ALPRAZolam (XANAX) tablet 0.25 mg (has no administration in time range)  cyanocobalamin (VITAMIN B12) tablet 1,000 mcg (has no administration in time range)  cholecalciferol (VITAMIN D3) 25 MCG (1000 UNIT) tablet 1,000 Units (has no administration in time range)  enoxaparin (LOVENOX) injection 30 mg (30 mg Subcutaneous Given 06/29/23 2227)  0.9 %  sodium chloride infusion (150 mL/hr Intravenous New Bag/Given 06/29/23 2158)  acetaminophen (TYLENOL) tablet 650 mg (650 mg Oral Given 06/29/23 2227)    Or  acetaminophen (TYLENOL) suppository 650 mg ( Rectal See Alternative 06/29/23 2227)  traZODone (DESYREL) tablet 25 mg (has no administration in time range)  magnesium hydroxide (MILK OF MAGNESIA) suspension 30 mL (has no administration in time range)  ondansetron (ZOFRAN) tablet 4 mg (has no administration in time range)    Or  ondansetron (ZOFRAN) injection 4 mg (has no administration in time range)  zinc sulfate capsule 220 mg (has no administration in time range)  clobetasol ointment (TEMOVATE) 0.05 % 1 Application (has no administration in time range)  triamcinolone cream (KENALOG) 0.1 %  cream 1 Application (has no administration in time range)  calcipotriene-betamethasone (TACLONEX) ointment (has no administration in time range)  sodium chloride 0.9 % bolus 1,000 mL (0 mLs Intravenous Stopped 06/29/23 1957)  sodium chloride 0.9 % bolus 1,250 mL (1,250 mLs Intravenous New Bag/Given 06/29/23 2022)     IMPRESSION / MDM / ASSESSMENT AND PLAN / ED COURSE  I reviewed the triage vital signs and the nursing notes.  Differential diagnosis includes, but is not limited to, UTI, dehydration, pneumonia, viral illness  Patient's presentation is most consistent with acute presentation with potential threat to life or bodily function.  62 year old female presenting to the  emergency department with fevers at home.  Afebrile here, but is tachycardic on presentation.  Her lab work demonstrates leukocytosis at 17.9.  Her CMP is notable for significant renal injury with a creatinine of 2.9, baseline around 1.  She does also have some hyponatremia with a sodium of 129.  Urinalysis is concerning for infection with large leukocyte Estrace, greater than 50 white blood cells, and many bacteria.  She does not have any abdominal pain or flank tenderness, no gross hematuria.  Overall low suspicion for pyelonephritis or renal stone.  However, she does have a likely UTI that meets SIRS criteria with significant renal injury.  With this, do think patient is appropriate for inpatient management.  She was ordered for IV fluids and Rocephin.  Will reach to hospitalist team to discuss admission. Clinical Course as of 06/30/23 0003  Wed Jun 29, 2023  1943 Lactic Acid, Venous(!!): 4.0 Lactate did return elevated at 4.  Does meet severe sepsis criteria.  Will order remaining bolus to complete 30 cc/kg of fluid resuscitation. [NR]    Clinical Course User Index [NR] Trinna Post, MD     FINAL CLINICAL IMPRESSION(S) / ED DIAGNOSES   Final diagnoses:  Acute cystitis without hematuria  Acute renal failure, unspecified  acute renal failure type (HCC)     Rx / DC Orders   ED Discharge Orders     None        Note:  This document was prepared using Dragon voice recognition software and may include unintentional dictation errors.   Trinna Post, MD 06/30/23 [id number]

## 2023-06-29 NOTE — Assessment & Plan Note (Signed)
-   We will can continue her as needed Xanax.

## 2023-06-29 NOTE — Sepsis Progress Note (Signed)
Elink will follow per sepsis protocol  

## 2023-06-29 NOTE — Assessment & Plan Note (Signed)
-   This is likely hypovolemic and is associated with hypokalemia. - She will be hydrated with IV normal saline and will follow her BMP.

## 2023-06-29 NOTE — Progress Notes (Signed)
CODE SEPSIS - PHARMACY COMMUNICATION  **Broad Spectrum Antibiotics should be administered within 1 hour of Sepsis diagnosis**  Time Code Sepsis Called/Page Received: 1822  Antibiotics Ordered: ceftriaxone  Time of 1st antibiotic administration: 1902  Additional action taken by pharmacy:    If necessary, Name of Provider/Nurse Contacted:      Angelique Blonder ,PharmD Clinical Pharmacist  06/29/2023  7:11 PM

## 2023-06-29 NOTE — Assessment & Plan Note (Signed)
-   We will continue her psoriasis ointments.

## 2023-06-29 NOTE — ED Triage Notes (Signed)
Patient c/o fever and chills on Friday. Reports she hasn't had as much water intake as normal. C/o constant headache and sinus pressure.   Also reports right side sciatica flare up

## 2023-06-29 NOTE — ED Notes (Signed)
   06/29/23 2118  Hand-off documentation  Hand-off Received Received from ED RN, ready to receive patient  Report received from (Full Name) Via Chart Reviw  [phone]   RN Tresa Moore can be reached at [phone]

## 2023-06-29 NOTE — Assessment & Plan Note (Signed)
-   Sepsis manifested by tachycardia and leukocytosis. - She meets severe sepsis criteria based on lactic acid of 4 and AKI. - She will be admitted to a medical telemetry bed. - We will continue antibiotic therapy with with IV Rocephin. - We will follow blood and urine cultures. - We will continue hydration with IV normal saline especially given her hyponatremia.

## 2023-06-29 NOTE — H&P (Signed)
[place]   PATIENT NAME: Virginia Galvan    MR#:  [id number]  DATE OF BIRTH:  1961-09-09  DATE OF ADMISSION:  06/29/2023  PRIMARY CARE PHYSICIAN: Barbette Reichmann, MD   Patient is coming from: Home  REQUESTING/REFERRING PHYSICIAN: Trinna Post, MD  CHIEF COMPLAINT:   Chief Complaint  Patient presents with   Fever   Dehydration    HISTORY OF PRESENT ILLNESS:  Virginia Galvan is a 62 y.o. Caucasian female with medical history significant for psoriasis, osteoarthritis and anxiety, who presented to the emergency room with acute onset of chills with associated subjective fever that started on Friday as well as dysuria that started about a week ago.  She stated that her dysuria has resolved.  She denies any urinary frequency or urgency or hematuria or flank pain.  She has not been drinking enough fluids while she was on vacation recently and was concerned about being dehydrated.  She has been having headache felt as pressure over her sinuses lately.  She admits to mild cough without expectoration or dyspnea or wheezing.  No chest pain or palpitations.  No nausea or vomiting or diarrhea or abdominal pain.  No bleeding diathesis.  ED Course: When she came to the ER, BP was 100/51 with a heart rate of 110 and temperature 99.3 with otherwise normal vital signs.  Labs revealed leukocytosis of 17.9 with neutrophilia, lactic acid of 4, sodium 129 with a chloride of 95, CO2 of 20 and BUN 38 and creatinine 2.92 compared to 16/0.94 on 04/28/2022.  UA was positive for UTI and showed more than 300 protein.  Urine culture was sent. EKG as reviewed by me : EKG showed normal sinus rhythm with a rate of 97 with poor R wave progression and low voltage QRS. Imaging: Portable chest ray showed no acute cardiopulmonary disease.  The patient was given 30 mill per kilogram IV normal saline bolus and 2 g of IV Rocephin.  She will be admitted to a medical telemetry bed for further evaluation and management. PAST  MEDICAL HISTORY:   Past Medical History:  Diagnosis Date   Anxiety    Arthritis    Psoriasis     PAST SURGICAL HISTORY:   Past Surgical History:  Procedure Laterality Date   BREAST CYST ASPIRATION Left 90s   COLONOSCOPY  06/2010   COLONOSCOPY WITH PROPOFOL N/A 07/02/2020   Procedure: COLONOSCOPY WITH PROPOFOL;  Surgeon: Pasty Spillers, MD;  Location: [facility] ENDOSCOPY;  Service: Endoscopy;  Laterality: N/A;   TUBAL LIGATION      SOCIAL HISTORY:   Social History   Tobacco Use   Smoking status: Former    Types: Cigarettes   Smokeless tobacco: Never  Substance Use Topics   Alcohol use: Yes    Alcohol/week: 6.0 standard drinks of alcohol    Types: 6 Cans of beer per week    FAMILY HISTORY:   Family History  Problem Relation Age of Onset   Breast cancer Maternal Grandmother    Asthma Mother    COPD Mother    Kidney disease Mother    Heart attack Father     DRUG ALLERGIES:  No Known Allergies  REVIEW OF SYSTEMS:   ROS As per history of present illness. All pertinent systems were reviewed above. Constitutional, HEENT, cardiovascular, respiratory, GI, GU, musculoskeletal, neuro, psychiatric, endocrine, integumentary and hematologic systems were reviewed and are otherwise negative/unremarkable except for positive findings mentioned above in the HPI.   MEDICATIONS AT HOME:  Prior to Admission medications   Medication Sig Start Date End Date Taking? Authorizing Provider  ALPRAZolam (XANAX) 0.25 MG tablet Take 1 tablet (0.25 mg total) by mouth 2 (two) times daily as needed for anxiety. 12/03/22   Joni Reining, PA-C  augmented betamethasone dipropionate (DIPROLENE-AF) 0.05 % ointment Apply topically. 01/01/21   [provider]  calcipotriene-betamethasone (TACLONEX) ointment Apply to affected area topically daily. Not use on face, armpit, groin. Not use longer than 4 weeks. 07/28/20   Emily Filbert, MD  Cholecalciferol (VITAMIN D3 PO) Take by  mouth.    [provider]  clobetasol ointment (TEMOVATE) 0.05 % Apply 1 application topically every morning.    [provider]  Fluocinolone Acetonide 0.01 % OIL Place 5 drops in ear(s) as needed. Use no more than 5 days 08/08/19   Jamelle Haring, MD  hydrocortisone butyrate (LUCOID) 0.1 % CREA cream Apply topically. 11/17/20   [provider]  meloxicam (MOBIC) 15 MG tablet Take 1 tablet (15 mg total) by mouth daily. 11/08/22   Joni Reining, PA-C  Multiple Vitamin (MULTI-VITAMIN) tablet Take by mouth.    [provider]  nitrofurantoin, macrocrystal-monohydrate, (MACROBID) 100 MG capsule Take 1 capsule (100 mg total) by mouth 2 (two) times daily. 04/13/22   Joni Reining, PA-C  triamcinolone cream (KENALOG) 0.1 % Apply 1 application topically 2 (two) times daily. Use sparingly for areas under the armpit region 08/08/19   Jamelle Haring, MD  vitamin B-12 (CYANOCOBALAMIN) 1000 MCG tablet Take by mouth.    [provider]      VITAL SIGNS:  Blood pressure 108/72, pulse 95, temperature 98.3 F (36.8 C), temperature source Oral, resp. rate (!) 22, height 5\' 7"  (1.702 m), weight 68 kg, SpO2 99%.  PHYSICAL EXAMINATION:  Physical Exam  GENERAL:  62 y.o.-year-old Caucasian female patient lying in the bed with no acute distress.  EYES: Pupils equal, round, reactive to light and accommodation. No scleral icterus. Extraocular muscles intact.  HEENT: Head atraumatic, normocephalic. Oropharynx and nasopharynx clear.  NECK:  Supple, no jugular venous distention. No thyroid enlargement, no tenderness.  LUNGS: Normal breath sounds bilaterally, no wheezing, rales,rhonchi or crepitation. No use of accessory muscles of respiration.  CARDIOVASCULAR: Regular rate and rhythm, S1, S2 normal. No murmurs, rubs, or gallops.  ABDOMEN: Soft, nondistended, nontender. Bowel sounds present. No organomegaly or mass.  EXTREMITIES: No pedal edema, cyanosis, or  clubbing.  NEUROLOGIC: Cranial nerves II through XII are intact. Muscle strength 5/5 in all extremities. Sensation intact. Gait not checked.  PSYCHIATRIC: The patient is alert and oriented x 3.  Normal affect and good eye contact. SKIN: No obvious rash, lesion, or ulcer.   LABORATORY PANEL:   CBC Recent Labs  Lab 06/29/23 1658  WBC 17.9*  HGB 12.5  HCT 37.3  PLT 249   ------------------------------------------------------------------------------------------------------------------  Chemistries  Recent Labs  Lab 06/29/23 1658  NA 129*  K 4.1  CL 95*  CO2 20*  GLUCOSE 137*  BUN 38*  CREATININE 2.92*  CALCIUM 8.6*  AST 35  ALT 28  ALKPHOS 235*  BILITOT 1.3*   ------------------------------------------------------------------------------------------------------------------  Cardiac Enzymes No results for input(s): "TROPONINI" in the last 168 hours. ------------------------------------------------------------------------------------------------------------------  RADIOLOGY:  DG Chest Port 1 View  Result Date: 06/29/2023 CLINICAL DATA:  Fever and chills EXAM: PORTABLE CHEST 1 VIEW COMPARISON:  None Available. FINDINGS: The heart size and mediastinal contours are within normal limits. Both lungs are clear. The visualized skeletal structures are  unremarkable. IMPRESSION: No active disease. Electronically Signed   By: Darliss Cheney M.D.   On: 06/29/2023 18:35      IMPRESSION AND PLAN:  Assessment and Plan: * Sepsis due to gram-negative UTI (HCC) - Sepsis manifested by tachycardia and leukocytosis. - She meets severe sepsis criteria based on lactic acid of 4 and AKI. - She will be admitted to a medical telemetry bed. - We will continue antibiotic therapy with with IV Rocephin. - We will follow blood and urine cultures. - We will continue hydration with IV normal saline especially given her hyponatremia.  AKI (acute kidney injury) (HCC) - This is likely prerenal due to  hypovolemia and mild dehydration. - The patient will be hydrated with IV normal saline and will follow her BMP. - We will hold off her meloxicam and avoid nephrotoxins.   Hyponatremia - This is likely hypovolemic and is associated with hypokalemia. - She will be hydrated with IV normal saline and will follow her BMP.  Anxiety - We will can continue her as needed Xanax.  Psoriasis - We will continue her psoriasis ointments.   DVT prophylaxis: Lovenox.  Advanced Care Planning:  Code Status: full code.  Family Communication:  The plan of care was discussed in details with the patient (and family). I answered all questions. The patient agreed to proceed with the above mentioned plan. Further management will depend upon hospital course. Disposition Plan: Back to previous home environment Consults called: none.  All the records are reviewed and case discussed with ED provider.  Status is: Inpatient  At the time of the admission, it appears that the appropriate admission status for this patient is inpatient.  This is judged to be reasonable and necessary in order to provide the required intensity of service to ensure the patient's safety given the presenting symptoms, physical exam findings and initial radiographic and laboratory data in the context of comorbid conditions.  The patient requires inpatient status due to high intensity of service, high risk of further deterioration and high frequency of surveillance required.  I certify that at the time of admission, it is my clinical judgment that the patient will require inpatient hospital care extending more than 2 midnights.                            Dispo: The patient is from: Home              Anticipated d/c is to: Home              Patient currently is not medically stable to d/c.              Difficult to place patient: No  Hannah Beat M.D on 06/29/2023 at 8:15 PM  [facility]   From 7 PM-7 AM, contact  night-coverage [email or web address]  CC: Primary care physician; Barbette Reichmann, MD

## 2023-06-29 NOTE — ED Notes (Signed)
Dr. Rosalia Hammers informed LA is 4.0

## 2023-06-30 ENCOUNTER — Inpatient Hospital Stay: Payer: 59

## 2023-06-30 DIAGNOSIS — N390 Urinary tract infection, site not specified: Secondary | ICD-10-CM | POA: Diagnosis not present

## 2023-06-30 DIAGNOSIS — A4150 Gram-negative sepsis, unspecified: Secondary | ICD-10-CM | POA: Diagnosis not present

## 2023-06-30 LAB — BASIC METABOLIC PANEL
Anion gap: 6 (ref 5–15)
BUN: 37 mg/dL — ABNORMAL HIGH (ref 8–23)
CO2: 19 mmol/L — ABNORMAL LOW (ref 22–32)
Calcium: 7.4 mg/dL — ABNORMAL LOW (ref 8.9–10.3)
Chloride: 107 mmol/L (ref 98–111)
GFR, Estimated: 18 mL/min — ABNORMAL LOW (ref >60)
Glucose, Bld: 122 mg/dL — ABNORMAL HIGH (ref 70–99)
Potassium: 3.9 mmol/L (ref 3.5–5.1)
Sodium: 132 mmol/L — ABNORMAL LOW (ref 135–145)

## 2023-06-30 LAB — CBC
HCT: 29.3 % — ABNORMAL LOW (ref 36.0–46.0)
Hemoglobin: 10 g/dL — ABNORMAL LOW (ref 12.0–15.0)
MCH: 31.7 pg (ref 26.0–34.0)
MCHC: 34.1 g/dL (ref 30.0–36.0)
MCV: 93 fL (ref 80.0–100.0)
Platelets: 205 10*3/uL (ref 150–400)
RBC: 3.15 MIL/uL — ABNORMAL LOW (ref 3.87–5.11)
RDW: 13.6 % (ref 11.5–15.5)
WBC: 12.8 10*3/uL — ABNORMAL HIGH (ref 4.0–10.5)
nRBC: 0 % (ref 0.0–0.2)

## 2023-06-30 LAB — CULTURE, BLOOD (ROUTINE X 2)
Culture: NO GROWTH
Culture: NO GROWTH
Special Requests: ADEQUATE

## 2023-06-30 LAB — PROTIME-INR
INR: 1.1 (ref 0.8–1.2)
Prothrombin Time: 14.8 seconds (ref 11.4–15.2)

## 2023-06-30 LAB — HIV ANTIBODY (ROUTINE TESTING W REFLEX): HIV Screen 4th Generation wRfx: NONREACTIVE

## 2023-06-30 LAB — CORTISOL-AM, BLOOD: Cortisol - AM: 9 ug/dL (ref 6.7–22.6)

## 2023-06-30 MED ORDER — GUAIFENESIN 100 MG/5ML PO LIQD
5.0000 mL | ORAL | Status: DC | PRN
  Administered 2023-06-30: 5 mL via ORAL
  Filled 2023-06-30: qty 10

## 2023-06-30 MED ORDER — ORAL CARE MOUTH RINSE: 15.0000 mL | OROMUCOSAL | Status: DC | PRN

## 2023-06-30 MED ORDER — LACTATED RINGERS IV SOLN: INTRAVENOUS | Status: DC

## 2023-06-30 NOTE — Progress Notes (Signed)
PROGRESS NOTE    Virginia Galvan  ION:[id number] DOB: 1961/04/02 DOA: 06/29/2023 PCP: Barbette Reichmann, MD    Brief Narrative:  62 y.o. Caucasian female with medical history significant for psoriasis, osteoarthritis and anxiety, who presented to the emergency room with acute onset of chills with associated subjective fever that started on Friday as well as dysuria that started about a week ago.  She stated that her dysuria has resolved.  She denies any urinary frequency or urgency or hematuria or flank pain.  She has not been drinking enough fluids while she was on vacation recently and was concerned about being dehydrated.  She has been having headache felt as pressure over her sinuses lately.  She admits to mild cough without expectoration or dyspnea or wheezing.  No chest pain or palpitations.  No nausea or vomiting or diarrhea or abdominal pain.  No bleeding diathesis.   7/25: Patient states she is feeling better  Assessment & Plan:   Principal Problem:   Sepsis due to gram-negative UTI (HCC) Active Problems:   AKI (acute kidney injury) (HCC)   Hyponatremia   Psoriasis   Anxiety   Severe sepsis (HCC)  Severe sepsis due to gram-negative UTI Sepsis criteria met with tachycardia and leukocytosis.  Presumed source is urine.  Severe sepsis criteria met with initial lactic acid elevation and AKI Plan: Continue IV Rocephin Aggressive IV fluids Follow blood and urine cultures Monitor vitals and fever curve  AKI (acute kidney injury) (HCC) Suspect in the setting of hypovolemia and dehydration Plan: Hold home Mobic.  Avoid nonessential nephrotoxins Check CT renal stone study, rule out nephrolithiasis Aggressive IVF as above     Hyponatremia Suspect hypovolemic hyponatremia.  Associated with hypokalemia Hydration as above   Anxiety As needed Xanax   Psoriasis PTA ointments   DVT prophylaxis: Lovenox Code Status: Full Family Communication: None today Disposition Plan:  Status is: Inpatient Remains inpatient appropriate because: UTI/severe sepsis on IV antibiotics   Level of care: Telemetry Medical  Consultants:  None  Procedures:  None  Antimicrobials: Ceftriaxone   Subjective: Seen and examined.  Resting in bed.  No visible distress  Objective: Vitals:   06/29/23 2154 06/30/23 0409 06/30/23 0439 06/30/23 0815  BP: (!) 141/78 (!) 96/55 109/60 (!) 94/59  Pulse: (!) 104 84 80 75  Resp: 20 20    Temp: 98 F (36.7 C) 98.1 F (36.7 C) 98.1 F (36.7 C) 98.5 F (36.9 C)  TempSrc: Oral  Oral Oral  SpO2: 100% 93% 100% 98%  Weight:      Height:        Intake/Output Summary (Last 24 hours) at 06/30/2023 1017 Last data filed at 06/30/2023 0400 Gross per 24 hour  Intake 1538.74 ml  Output --  Net 1538.74 ml   Filed Weights   06/29/23 1654  Weight: 68 kg    Examination:  General exam: Appears calm and comfortable  Respiratory system: Clear to auscultation. Respiratory effort normal. Cardiovascular system: S1-S2, RRR, no murmurs, no pedal edema Gastrointestinal system: Thin, soft, NT/ND, normal bowel sounds Central nervous system: Alert and oriented. No focal neurological deficits. Extremities: Symmetric 5 x 5 power. Skin: No rashes, lesions or ulcers Psychiatry: Judgement and insight appear normal. Mood & affect appropriate.     Data Reviewed: I have personally reviewed following labs and imaging studies  CBC: Recent Labs  Lab 06/29/23 1658 06/30/23 0309  WBC 17.9* 12.8*  NEUTROABS 15.0*  --   HGB 12.5 10.0*  HCT 37.3 29.3*  MCV 93.0 93.0  PLT 249 205   Basic Metabolic Panel: Recent Labs  Lab 06/29/23 1658 06/30/23 0309  NA 129* 132*  K 4.1 3.9  CL 95* 107  CO2 20* 19*  GLUCOSE 137* 122*  BUN 38* 37*  CREATININE 2.92* 2.89*  CALCIUM 8.6* 7.4*   GFR: Estimated Creatinine Clearance: 19.4 mL/min (A) (by C-G formula based on SCr of 2.89 mg/dL (H)). Liver Function Tests: Recent Labs  Lab 06/29/23 1658  AST  35  ALT 28  ALKPHOS 235*  BILITOT 1.3*  PROT 7.5  ALBUMIN 3.1*   No results for input(s): "LIPASE", "AMYLASE" in the last 168 hours. No results for input(s): "AMMONIA" in the last 168 hours. Coagulation Profile: Recent Labs  Lab 06/30/23 0309  INR 1.1   Cardiac Enzymes: No results for input(s): "CKTOTAL", "CKMB", "CKMBINDEX", "TROPONINI" in the last 168 hours. BNP (last 3 results) No results for input(s): "PROBNP" in the last 8760 hours. HbA1C: No results for input(s): "HGBA1C" in the last 72 hours. CBG: No results for input(s): "GLUCAP" in the last 168 hours. Lipid Profile: No results for input(s): "CHOL", "HDL", "LDLCALC", "TRIG", "CHOLHDL", "LDLDIRECT" in the last 72 hours. Thyroid Function Tests: No results for input(s): "TSH", "T4TOTAL", "FREET4", "T3FREE", "THYROIDAB" in the last 72 hours. Anemia Panel: No results for input(s): "VITAMINB12", "FOLATE", "FERRITIN", "TIBC", "IRON", "RETICCTPCT" in the last 72 hours. Sepsis Labs: Recent Labs  Lab 06/29/23 1854 06/29/23 2024 06/30/23 0309  PROCALCITON  --   --  7.70  LATICACIDVEN 4.0* 1.5  --     Recent Results (from the past 240 hour(s))  Blood Culture (routine x 2)     Status: None (Preliminary result)   Collection Time: 06/29/23  6:21 PM   Specimen: BLOOD  Result Value Ref Range Status   Specimen Description BLOOD RIGHT ANTECUBITAL  Final   Special Requests   Final    BOTTLES DRAWN AEROBIC AND ANAEROBIC Blood Culture results may not be optimal due to an inadequate volume of blood received in culture bottles   Culture   Final    NO GROWTH < 12 HOURS Performed at [facility], [address], [place], [place] [address]    Report Status PENDING  Incomplete  Resp panel by RT-PCR (RSV, Flu A&B, Covid) Anterior Nasal Swab     Status: None   Collection Time: 06/29/23  6:54 PM   Specimen: Anterior Nasal Swab  Result Value Ref Range Status   SARS Coronavirus 2 by RT PCR NEGATIVE NEGATIVE Final    Comment:  (NOTE) SARS-CoV-2 target nucleic acids are NOT DETECTED.  The SARS-CoV-2 RNA is generally detectable in upper respiratory specimens during the acute phase of infection. The lowest concentration of SARS-CoV-2 viral copies this assay can detect is 138 copies/mL. A negative result does not preclude SARS-Cov-2 infection and should not be used as the sole basis for treatment or other patient management decisions. A negative result may occur with  improper specimen collection/handling, submission of specimen other than nasopharyngeal swab, presence of viral mutation(s) within the areas targeted by this assay, and inadequate number of viral copies(<138 copies/mL). A negative result must be combined with clinical observations, patient history, and epidemiological information. The expected result is Negative.  Fact Sheet for Patients:  BloggerCourse.com  Fact Sheet for Healthcare Providers:  SeriousBroker.it  This test is no t yet approved or cleared by the [place] FDA and  has been authorized for detection and/or diagnosis of SARS-CoV-2 by FDA under an Emergency  Use Authorization (EUA). This EUA will remain  in effect (meaning this test can be used) for the duration of the COVID-19 declaration under Section 564(b)(1) of the Act, 21 U.S.C.section 360bbb-3(b)(1), unless the authorization is terminated  or revoked sooner.       Influenza A by PCR NEGATIVE NEGATIVE Final   Influenza B by PCR NEGATIVE NEGATIVE Final    Comment: (NOTE) The Xpert Xpress SARS-CoV-2/FLU/RSV plus assay is intended as an aid in the diagnosis of influenza from Nasopharyngeal swab specimens and should not be used as a sole basis for treatment. Nasal washings and aspirates are unacceptable for Xpert Xpress SARS-CoV-2/FLU/RSV testing.  Fact Sheet for Patients: BloggerCourse.com  Fact Sheet for Healthcare  Providers: SeriousBroker.it  This test is not yet approved or cleared by the [place] FDA and has been authorized for detection and/or diagnosis of SARS-CoV-2 by FDA under an Emergency Use Authorization (EUA). This EUA will remain in effect (meaning this test can be used) for the duration of the COVID-19 declaration under Section 564(b)(1) of the Act, 21 U.S.C. section 360bbb-3(b)(1), unless the authorization is terminated or revoked.     Resp Syncytial Virus by PCR NEGATIVE NEGATIVE Final    Comment: (NOTE) Fact Sheet for Patients: BloggerCourse.com  Fact Sheet for Healthcare Providers: SeriousBroker.it  This test is not yet approved or cleared by the [place] FDA and has been authorized for detection and/or diagnosis of SARS-CoV-2 by FDA under an Emergency Use Authorization (EUA). This EUA will remain in effect (meaning this test can be used) for the duration of the COVID-19 declaration under Section 564(b)(1) of the Act, 21 U.S.C. section 360bbb-3(b)(1), unless the authorization is terminated or revoked.  Performed at [facility], [address] [place], [place] [address]   Blood Culture (routine x 2)     Status: None (Preliminary result)   Collection Time: 06/29/23  7:14 PM   Specimen: BLOOD  Result Value Ref Range Status   Specimen Description BLOOD LEFT ANTECUBITAL  Final   Special Requests   Final    BOTTLES DRAWN AEROBIC AND ANAEROBIC Blood Culture adequate volume   Culture   Final    NO GROWTH < 12 HOURS Performed at [facility], [address], [place], [place] [address]    Report Status PENDING  Incomplete         Radiology Studies: DG Chest Port 1 View  Result Date: 06/29/2023 CLINICAL DATA:  Fever and chills EXAM: PORTABLE CHEST 1 VIEW COMPARISON:  None Available. FINDINGS: The heart size and mediastinal contours are within normal limits. Both  lungs are clear. The visualized skeletal structures are unremarkable. IMPRESSION: No active disease. Electronically Signed   By: Darliss Cheney M.D.   On: 06/29/2023 18:35        Scheduled Meds:  cholecalciferol  1,000 Units Oral Daily   cyanocobalamin  1,000 mcg Oral Daily   enoxaparin (LOVENOX) injection  30 mg Subcutaneous Q24H   zinc sulfate  220 mg Oral Daily   Continuous Infusions:  cefTRIAXone (ROCEPHIN)  IV Stopped (06/29/23 1934)   lactated ringers 125 mL/hr at 06/30/23 0835     LOS: 1 day   Tresa Moore, MD [facility]   If 7PM-7AM, please contact night-coverage  06/30/2023, 10:17 AM

## 2023-06-30 NOTE — Plan of Care (Signed)

## 2023-07-01 DIAGNOSIS — N390 Urinary tract infection, site not specified: Secondary | ICD-10-CM | POA: Diagnosis not present

## 2023-07-01 DIAGNOSIS — A4150 Gram-negative sepsis, unspecified: Secondary | ICD-10-CM | POA: Diagnosis not present

## 2023-07-01 LAB — CBC WITH DIFFERENTIAL/PLATELET
Abs Immature Granulocytes: 0.39 10*3/uL — ABNORMAL HIGH (ref 0.00–0.07)
Basophils Absolute: 0.1 10*3/uL (ref 0.0–0.1)
Basophils Relative: 0 %
Eosinophils Absolute: 0.1 10*3/uL (ref 0.0–0.5)
Eosinophils Relative: 1 %
HCT: 28.1 % — ABNORMAL LOW (ref 36.0–46.0)
Hemoglobin: 9.5 g/dL — ABNORMAL LOW (ref 12.0–15.0)
Immature Granulocytes: 2 %
Lymphocytes Relative: 4 %
Lymphs Abs: 0.7 10*3/uL (ref 0.7–4.0)
MCH: 30.9 pg (ref 26.0–34.0)
MCHC: 33.8 g/dL (ref 30.0–36.0)
MCV: 91.5 fL (ref 80.0–100.0)
Monocytes Absolute: 1.5 10*3/uL — ABNORMAL HIGH (ref 0.1–1.0)
Monocytes Relative: 9 %
Neutro Abs: 13.8 10*3/uL — ABNORMAL HIGH (ref 1.7–7.7)
Neutrophils Relative %: 84 %
Platelets: 278 10*3/uL (ref 150–400)
RBC: 3.07 MIL/uL — ABNORMAL LOW (ref 3.87–5.11)
RDW: 13.5 % (ref 11.5–15.5)
WBC: 16.6 10*3/uL — ABNORMAL HIGH (ref 4.0–10.5)
nRBC: 0 % (ref 0.0–0.2)

## 2023-07-01 LAB — BASIC METABOLIC PANEL
Anion gap: 11 (ref 5–15)
BUN: 34 mg/dL — ABNORMAL HIGH (ref 8–23)
CO2: 18 mmol/L — ABNORMAL LOW (ref 22–32)
Calcium: 8.1 mg/dL — ABNORMAL LOW (ref 8.9–10.3)
Chloride: 106 mmol/L (ref 98–111)
Creatinine, Ser: 2.6 mg/dL — ABNORMAL HIGH (ref 0.44–1.00)
GFR, Estimated: 20 mL/min — ABNORMAL LOW (ref >60)
Glucose, Bld: 105 mg/dL — ABNORMAL HIGH (ref 70–99)
Potassium: 4 mmol/L (ref 3.5–5.1)
Sodium: 135 mmol/L (ref 135–145)

## 2023-07-01 LAB — PROCALCITONIN: Procalcitonin: 4.34 ng/mL

## 2023-07-01 NOTE — Progress Notes (Signed)
Transition of Care [facility]) - Inpatient Brief Assessment   Patient Details  Name: SHENEICE BROCKSCHMIDT MRN: [id number] Date of Birth: 1961-07-03  Transition of Care [facility] CM/SW Contact:    Truddie Hidden, RN Phone Number: 07/01/2023, 10:47 AM   Clinical Narrative: TOC assessing for ongoing needs and discharge planning.   Transition of Care Asessment: Insurance and Status: Insurance coverage has been reviewed Patient has primary care physician: Yes Home environment has been reviewed: Return to home Prior level of function:: Independent Prior/Current Home Services: No current home services Social Determinants of Health Reivew: SDOH reviewed no interventions necessary Readmission risk has been reviewed: Yes Transition of care needs: no transition of care needs at this time

## 2023-07-01 NOTE — Progress Notes (Signed)
PROGRESS NOTE    Virginia PARRON  KGM:[id number] DOB: 15-Mar-1961 DOA: 06/29/2023 PCP: Barbette Reichmann, MD    Brief Narrative:  62 y.o. Caucasian female with medical history significant for psoriasis, osteoarthritis and anxiety, who presented to the emergency room with acute onset of chills with associated subjective fever that started on Friday as well as dysuria that started about a week ago.  She stated that her dysuria has resolved.  She denies any urinary frequency or urgency or hematuria or flank pain.  She has not been drinking enough fluids while she was on vacation recently and was concerned about being dehydrated.  She has been having headache felt as pressure over her sinuses lately.  She admits to mild cough without expectoration or dyspnea or wheezing.  No chest pain or palpitations.  No nausea or vomiting or diarrhea or abdominal pain.  No bleeding diathesis.   7/25: Patient states she is feeling better 7/26: No acute changes.  Kidney function improving.  White count slightly increased.  Low grade temperature noted  Assessment & Plan:   Principal Problem:   Sepsis due to gram-negative UTI (HCC) Active Problems:   AKI (acute kidney injury) (HCC)   Hyponatremia   Psoriasis   Anxiety   Severe sepsis (HCC)  Severe sepsis due to gram-negative UTI Mild pyelonephritis Sepsis criteria met with tachycardia and leukocytosis.  Presumed source is urine.  Severe sepsis criteria met with initial lactic acid elevation and AKI.  Renal stone study with mild perinephric fat stranding Plan: Continue IV Rocephin Continue IVF Follow blood and urine cultures, no growth to date Monitor vitals and fever curve  AKI (acute kidney injury) (HCC) Suspect in the setting of hypovolemia and dehydration Improving but not at baseline Plan: Continue holding home Mobic Avoid nephrotoxins IVF as above Recheck kidney function in a.m.   Hyponatremia Suspect hypovolemic hyponatremia.  Associated with  hypokalemia Hydration as above   Anxiety As needed Xanax   Psoriasis PTA ointments   DVT prophylaxis: Lovenox Code Status: Full Family Communication: None today Disposition Plan: Status is: Inpatient Remains inpatient appropriate because: UTI/severe sepsis on IV antibiotics   Level of care: Telemetry Medical  Consultants:  None  Procedures:  None  Antimicrobials: Ceftriaxone   Subjective: Seen and examined.  Sitting in bed.  No visible distress  Objective: Vitals:   06/30/23 1931 06/30/23 2037 07/01/23 0619 07/01/23 0759  BP: (!) 94/59 (!) 100/52 (!) 102/55 103/60  Pulse: 88 81 80 75  Resp: 20 16 15 18   Temp: (!) 101.7 F (38.7 C) 98.4 F (36.9 C) 100.2 F (37.9 C) 98.7 F (37.1 C)  TempSrc: Oral Oral Oral Oral  SpO2: 96% 96% 97% 98%  Weight:      Height:        Intake/Output Summary (Last 24 hours) at 07/01/2023 1129 Last data filed at 07/01/2023 0450 Gross per 24 hour  Intake 2489.03 ml  Output --  Net 2489.03 ml   Filed Weights   06/29/23 1654  Weight: 68 kg    Examination:  General exam: NAD. mildly anxious Respiratory system: Lungs clear.  Normal work of breathing.  Room air Cardiovascular system: S1-S2, RRR, no murmurs, no pedal edema Gastrointestinal system: Thin, soft, NT/ND, normal bowel sounds Central nervous system: Alert and oriented. No focal neurological deficits. Extremities: Symmetric 5 x 5 power. Skin: No rashes, lesions or ulcers Psychiatry: Judgement and insight appear normal. Mood & affect appropriate.     Data Reviewed: I have personally reviewed following  labs and imaging studies  CBC: Recent Labs  Lab 06/29/23 1658 06/30/23 0309 07/01/23 0639  WBC 17.9* 12.8* 16.6*  NEUTROABS 15.0*  --  13.8*  HGB 12.5 10.0* 9.5*  HCT 37.3 29.3* 28.1*  MCV 93.0 93.0 91.5  PLT 249 205 278   Basic Metabolic Panel: Recent Labs  Lab 06/29/23 1658 06/30/23 0309 07/01/23 0639  NA 129* 132* 135  K 4.1 3.9 4.0  CL 95* 107  106  CO2 20* 19* 18*  GLUCOSE 137* 122* 105*  BUN 38* 37* 34*  CREATININE 2.92* 2.89* 2.60*  CALCIUM 8.6* 7.4* 8.1*   GFR: Estimated Creatinine Clearance: 21.5 mL/min (A) (by C-G formula based on SCr of 2.6 mg/dL (H)). Liver Function Tests: Recent Labs  Lab 06/29/23 1658  AST 35  ALT 28  ALKPHOS 235*  BILITOT 1.3*  PROT 7.5  ALBUMIN 3.1*   No results for input(s): "LIPASE", "AMYLASE" in the last 168 hours. No results for input(s): "AMMONIA" in the last 168 hours. Coagulation Profile: Recent Labs  Lab 06/30/23 0309  INR 1.1   Cardiac Enzymes: No results for input(s): "CKTOTAL", "CKMB", "CKMBINDEX", "TROPONINI" in the last 168 hours. BNP (last 3 results) No results for input(s): "PROBNP" in the last 8760 hours. HbA1C: No results for input(s): "HGBA1C" in the last 72 hours. CBG: No results for input(s): "GLUCAP" in the last 168 hours. Lipid Profile: No results for input(s): "CHOL", "HDL", "LDLCALC", "TRIG", "CHOLHDL", "LDLDIRECT" in the last 72 hours. Thyroid Function Tests: No results for input(s): "TSH", "T4TOTAL", "FREET4", "T3FREE", "THYROIDAB" in the last 72 hours. Anemia Panel: No results for input(s): "VITAMINB12", "FOLATE", "FERRITIN", "TIBC", "IRON", "RETICCTPCT" in the last 72 hours. Sepsis Labs: Recent Labs  Lab 06/29/23 1854 06/29/23 2024 06/30/23 0309 07/01/23 0639  PROCALCITON  --   --  7.70 4.34  LATICACIDVEN 4.0* 1.5  --   --     Recent Results (from the past 240 hour(s))  Urine Culture     Status: Abnormal (Preliminary result)   Collection Time: 06/29/23  4:58 PM   Specimen: Urine, Clean Catch  Result Value Ref Range Status   Specimen Description   Final    URINE, CLEAN CATCH Performed at [facility], [address], [place], [place] [address]    Special Requests   Final    NONE Performed at [facility], [address] [place], [place] [address]    Culture (A)  Final    >=100,000 COLONIES/mL Romie Minus NEGATIVE  RODS SUSCEPTIBILITIES TO FOLLOW Performed at [facility], [address], [place], [place] [address]    Report Status PENDING  Incomplete  Blood Culture (routine x 2)     Status: None (Preliminary result)   Collection Time: 06/29/23  6:21 PM   Specimen: BLOOD  Result Value Ref Range Status   Specimen Description BLOOD RIGHT ANTECUBITAL  Final   Special Requests   Final    BOTTLES DRAWN AEROBIC AND ANAEROBIC Blood Culture results may not be optimal due to an inadequate volume of blood received in culture bottles   Culture   Final    NO GROWTH 2 DAYS Performed at [facility], [address] [place], [place] [address]    Report Status PENDING  Incomplete  Resp panel by RT-PCR (RSV, Flu A&B, Covid) Anterior Nasal Swab     Status: None   Collection Time: 06/29/23  6:54 PM   Specimen: Anterior Nasal Swab  Result Value Ref Range Status   SARS  Coronavirus 2 by RT PCR NEGATIVE NEGATIVE Final    Comment: (NOTE) SARS-CoV-2 target nucleic acids are NOT DETECTED.  The SARS-CoV-2 RNA is generally detectable in upper respiratory specimens during the acute phase of infection. The lowest concentration of SARS-CoV-2 viral copies this assay can detect is 138 copies/mL. A negative result does not preclude SARS-Cov-2 infection and should not be used as the sole basis for treatment or other patient management decisions. A negative result may occur with  improper specimen collection/handling, submission of specimen other than nasopharyngeal swab, presence of viral mutation(s) within the areas targeted by this assay, and inadequate number of viral copies(<138 copies/mL). A negative result must be combined with clinical observations, patient history, and epidemiological information. The expected result is Negative.  Fact Sheet for Patients:  BloggerCourse.com  Fact Sheet for Healthcare Providers:  SeriousBroker.it  This test is no  t yet approved or cleared by the [place] FDA and  has been authorized for detection and/or diagnosis of SARS-CoV-2 by FDA under an Emergency Use Authorization (EUA). This EUA will remain  in effect (meaning this test can be used) for the duration of the COVID-19 declaration under Section 564(b)(1) of the Act, 21 U.S.C.section 360bbb-3(b)(1), unless the authorization is terminated  or revoked sooner.       Influenza A by PCR NEGATIVE NEGATIVE Final   Influenza B by PCR NEGATIVE NEGATIVE Final    Comment: (NOTE) The Xpert Xpress SARS-CoV-2/FLU/RSV plus assay is intended as an aid in the diagnosis of influenza from Nasopharyngeal swab specimens and should not be used as a sole basis for treatment. Nasal washings and aspirates are unacceptable for Xpert Xpress SARS-CoV-2/FLU/RSV testing.  Fact Sheet for Patients: BloggerCourse.com  Fact Sheet for Healthcare Providers: SeriousBroker.it  This test is not yet approved or cleared by the [place] FDA and has been authorized for detection and/or diagnosis of SARS-CoV-2 by FDA under an Emergency Use Authorization (EUA). This EUA will remain in effect (meaning this test can be used) for the duration of the COVID-19 declaration under Section 564(b)(1) of the Act, 21 U.S.C. section 360bbb-3(b)(1), unless the authorization is terminated or revoked.     Resp Syncytial Virus by PCR NEGATIVE NEGATIVE Final    Comment: (NOTE) Fact Sheet for Patients: BloggerCourse.com  Fact Sheet for Healthcare Providers: SeriousBroker.it  This test is not yet approved or cleared by the [place] FDA and has been authorized for detection and/or diagnosis of SARS-CoV-2 by FDA under an Emergency Use Authorization (EUA). This EUA will remain in effect (meaning this test can be used) for the duration of the COVID-19 declaration under Section  564(b)(1) of the Act, 21 U.S.C. section 360bbb-3(b)(1), unless the authorization is terminated or revoked.  Performed at [facility], [address] [place], [place] [address]   Blood Culture (routine x 2)     Status: None (Preliminary result)   Collection Time: 06/29/23  7:14 PM   Specimen: BLOOD  Result Value Ref Range Status   Specimen Description BLOOD LEFT ANTECUBITAL  Final   Special Requests   Final    BOTTLES DRAWN AEROBIC AND ANAEROBIC Blood Culture adequate volume   Culture   Final    NO GROWTH 2 DAYS Performed at [facility], [address] [place], [place] [address]    Report Status PENDING  Incomplete         Radiology Studies: CT RENAL STONE STUDY  Result Date: 06/30/2023 CLINICAL DATA:  Abdominal pain, flank pain EXAM: CT ABDOMEN  AND PELVIS WITHOUT CONTRAST TECHNIQUE: Multidetector CT imaging of the abdomen and pelvis was performed following the standard protocol without IV contrast. RADIATION DOSE REDUCTION: This exam was performed according to the departmental dose-optimization program which includes automated exposure control, adjustment of the mA and/or kV according to patient size and/or use of iterative reconstruction technique. COMPARISON:  None Available. FINDINGS: Lower chest: Minimal bilateral pleural effusions are seen. Hepatobiliary: There is a 2 cm low-density in the anterior aspect of liver in image 17 of series 2, possibly a cyst or hemangioma. There is no dilation of bile ducts. Gallbladder is unremarkable. Pancreas: No focal abnormalities are seen. Spleen: Unremarkable. Adrenals/Urinary Tract: There is mild nodularity in left adrenal. There is no hydronephrosis. There is mild perinephric stranding. There are no renal or ureteral stones. Urinary bladder is unremarkable. Stomach/Bowel: Small hiatal hernia is seen. Stomach is not distended. Small bowel loops are not dilated. Appendix is not dilated. There is no significant thickening in  colon. There is no pericolic stranding. Vascular/Lymphatic: Small scattered arterial calcifications are seen. Reproductive: Uterus is retroverted. Trace amount of fluid is noted in cul-de-sac. Other: There is no pneumoperitoneum. Small umbilical hernia containing fat is seen. Musculoskeletal: No acute findings are seen. IMPRESSION: There is no evidence of intestinal obstruction or pneumoperitoneum. There is no hydronephrosis. Appendix is not dilated. Minimal bilateral pleural effusions. Trace amount of fluid is seen in pelvis, possibly due to nonspecific enteritis. Small hiatal hernia. There is mild stranding in perinephric fat on both sides, possibly residual from previous inflammation or previous ureteric obstruction. There is a 2 cm low-density in the liver, possibly cyst or hemangioma. Evaluation is limited in this noncontrast study. Electronically Signed   By: Ernie Avena M.D.   On: 06/30/2023 13:46   DG Chest Port 1 View  Result Date: 06/29/2023 CLINICAL DATA:  Fever and chills EXAM: PORTABLE CHEST 1 VIEW COMPARISON:  None Available. FINDINGS: The heart size and mediastinal contours are within normal limits. Both lungs are clear. The visualized skeletal structures are unremarkable. IMPRESSION: No active disease. Electronically Signed   By: Darliss Cheney M.D.   On: 06/29/2023 18:35        Scheduled Meds:  cholecalciferol  1,000 Units Oral Daily   cyanocobalamin  1,000 mcg Oral Daily   enoxaparin (LOVENOX) injection  30 mg Subcutaneous Q24H   zinc sulfate  220 mg Oral Daily   Continuous Infusions:  cefTRIAXone (ROCEPHIN)  IV Stopped (06/30/23 1814)   lactated ringers 125 mL/hr at 07/01/23 0934     LOS: 2 days   Tresa Moore, MD [facility]   If 7PM-7AM, please contact night-coverage  07/01/2023, 11:29 AM

## 2023-07-02 DIAGNOSIS — A4150 Gram-negative sepsis, unspecified: Secondary | ICD-10-CM | POA: Diagnosis not present

## 2023-07-02 DIAGNOSIS — N390 Urinary tract infection, site not specified: Secondary | ICD-10-CM | POA: Diagnosis not present

## 2023-07-02 MED ORDER — CEFADROXIL 500 MG PO CAPS: ORAL_CAPSULE | ORAL | 0 refills | Status: AC

## 2023-07-02 NOTE — Discharge Summary (Signed)
Physician Discharge Summary  SHANASIA NIERENBERG UJW:[id number] DOB: September 11, 1961 DOA: 06/29/2023  PCP: Barbette Reichmann, MD  Admit date: 06/29/2023 Discharge date: 07/02/2023  Admitted From: Home Disposition:  Home  Recommendations for Outpatient Follow-up:  Follow up with PCP in 1-2 weeks   Home Health:No Equipment/Devices:None   Discharge Condition:Stable  CODE STATUS:FULL  Diet recommendation: Reg  Brief/Interim Summary:  62 y.o. Caucasian female with medical history significant for psoriasis, osteoarthritis and anxiety, who presented to the emergency room with acute onset of chills with associated subjective fever that started on Friday as well as dysuria that started about a week ago.  She stated that her dysuria has resolved.  She denies any urinary frequency or urgency or hematuria or flank pain.  She has not been drinking enough fluids while she was on vacation recently and was concerned about being dehydrated.  She has been having headache felt as pressure over her sinuses lately.  She admits to mild cough without expectoration or dyspnea or wheezing.  No chest pain or palpitations.  No nausea or vomiting or diarrhea or abdominal pain.  No bleeding diathesis.    7/25: Patient states she is feeling better 7/26: No acute changes.  Kidney function improving.  White count slightly increased.  Low grade temperature noted 7/27: Kidney function continues to improve.  White count downtrending.  No fevers over interval.  Urine culture with E. coli sensitive to first generation cephalosporins.  De-escalate antibiotics to p.o. cefadroxil.  Complete additional 5 days for total 10-day antibiotic course.    Discharge Diagnoses:  Principal Problem:   Sepsis due to gram-negative UTI (HCC) Active Problems:   AKI (acute kidney injury) (HCC)   Hyponatremia   Psoriasis   Anxiety   Severe sepsis (HCC)  Severe sepsis due to gram-negative UTI Mild pyelonephritis Sepsis criteria met with  tachycardia and leukocytosis.  Presumed source is urine.  Severe sepsis criteria met with initial lactic acid elevation and AKI.  Renal stone study with mild perinephric fat stranding Plan: Will treat as presumed pyelonephritis.  10-day antibiotic course.  De-escalate to p.o. cefadroxil.  Prescribed on discharge.  Patient's kidney function and white count is downtrending but not yet normal.  Recommend adequate p.o. hydration at home, completion of antibiotic course.  Follow-up with PCP in 1 week for repeat CBC, CMP, UA   AKI (acute kidney injury) (HCC) Suspect in the setting of hypovolemia and dehydration Improving but not at baseline Plan: Kidney function remains abnormal at time of discharge.  Recommend to hold home Mobic and follow-up PCP 1 week for repeat lab work   Discharge Instructions  Discharge Instructions     Diet - low sodium heart healthy   Complete by: As directed    Increase activity slowly   Complete by: As directed       Allergies as of 07/02/2023   No Known Allergies      Medication List     STOP taking these medications    ALPRAZolam 0.25 MG tablet Commonly known as: XANAX   augmented betamethasone dipropionate 0.05 % ointment Commonly known as: DIPROLENE-AF   calcipotriene-betamethasone ointment Commonly known as: TACLONEX   clobetasol ointment 0.05 % Commonly known as: TEMOVATE   cyanocobalamin 1000 MCG tablet Commonly known as: VITAMIN B12   Fluocinolone Acetonide 0.01 % Oil   hydrocortisone butyrate 0.1 % Crea cream Commonly known as: LUCOID   meloxicam 15 MG tablet Commonly known as: MOBIC   nitrofurantoin (macrocrystal-monohydrate) 100 MG capsule Commonly known as: Macrobid  triamcinolone cream 0.1 % Commonly known as: KENALOG   VITAMIN D3 PO   Zinc Sulfate 220 (50 Zn) MG Tabs       TAKE these medications    cefadroxil 500 MG capsule Commonly known as: DURICEF Take 2 capsules (1,000 mg total) by mouth daily at 12 noon for  1 day, THEN 1 capsule (500 mg total) daily at 12 noon for 5 days. Start taking on: July 03, 2023   Multi-Vitamin tablet Take by mouth.        Follow-up Information     Barbette Reichmann, MD. Call in 1 week(s).   Specialty: Internal Medicine Why: Please call on Monday to schedule an appointment for next week -   Follow up with Dr. Marcello Fennel in approximately 1 week.  Repeat labs including CBC, BMP, urinalysis Contact information: [address] [place] [address] [phone]                No Known Allergies  Consultations: None   Procedures/Studies: CT RENAL STONE STUDY  Result Date: 06/30/2023 CLINICAL DATA:  Abdominal pain, flank pain EXAM: CT ABDOMEN AND PELVIS WITHOUT CONTRAST TECHNIQUE: Multidetector CT imaging of the abdomen and pelvis was performed following the standard protocol without IV contrast. RADIATION DOSE REDUCTION: This exam was performed according to the departmental dose-optimization program which includes automated exposure control, adjustment of the mA and/or kV according to patient size and/or use of iterative reconstruction technique. COMPARISON:  None Available. FINDINGS: Lower chest: Minimal bilateral pleural effusions are seen. Hepatobiliary: There is a 2 cm low-density in the anterior aspect of liver in image 17 of series 2, possibly a cyst or hemangioma. There is no dilation of bile ducts. Gallbladder is unremarkable. Pancreas: No focal abnormalities are seen. Spleen: Unremarkable. Adrenals/Urinary Tract: There is mild nodularity in left adrenal. There is no hydronephrosis. There is mild perinephric stranding. There are no renal or ureteral stones. Urinary bladder is unremarkable. Stomach/Bowel: Small hiatal hernia is seen. Stomach is not distended. Small bowel loops are not dilated. Appendix is not dilated. There is no significant thickening in colon. There is no pericolic stranding. Vascular/Lymphatic: Small scattered  arterial calcifications are seen. Reproductive: Uterus is retroverted. Trace amount of fluid is noted in cul-de-sac. Other: There is no pneumoperitoneum. Small umbilical hernia containing fat is seen. Musculoskeletal: No acute findings are seen. IMPRESSION: There is no evidence of intestinal obstruction or pneumoperitoneum. There is no hydronephrosis. Appendix is not dilated. Minimal bilateral pleural effusions. Trace amount of fluid is seen in pelvis, possibly due to nonspecific enteritis. Small hiatal hernia. There is mild stranding in perinephric fat on both sides, possibly residual from previous inflammation or previous ureteric obstruction. There is a 2 cm low-density in the liver, possibly cyst or hemangioma. Evaluation is limited in this noncontrast study. Electronically Signed   By: Ernie Avena M.D.   On: 06/30/2023 13:46   DG Chest Port 1 View  Result Date: 06/29/2023 CLINICAL DATA:  Fever and chills EXAM: PORTABLE CHEST 1 VIEW COMPARISON:  None Available. FINDINGS: The heart size and mediastinal contours are within normal limits. Both lungs are clear. The visualized skeletal structures are unremarkable. IMPRESSION: No active disease. Electronically Signed   By: Darliss Cheney M.D.   On: 06/29/2023 18:35      Subjective: Seen and examined on the day of discharge.  Stable no distress.  Appropriate for discharge home.  Discharge Exam: Vitals:   07/02/23 0501 07/02/23 0816  BP: 130/72 (!) 109/59  Pulse: 83 74  Resp: 18 18  Temp: 99.6 F (37.6 C) 99.1 F (37.3 C)  SpO2: 100% 97%   Vitals:   07/01/23 0759 07/01/23 2033 07/02/23 0501 07/02/23 0816  BP: 103/60 120/63 130/72 (!) 109/59  Pulse: 75 83 83 74  Resp: 18 18 18 18   Temp: 98.7 F (37.1 C) 99.6 F (37.6 C) 99.6 F (37.6 C) 99.1 F (37.3 C)  TempSrc: Oral Oral Oral Oral  SpO2: 98% 98% 100% 97%  Weight:      Height:        General: Pt is alert, awake, not in acute distress Cardiovascular: RRR, S1/S2 +, no rubs, no  gallops Respiratory: CTA bilaterally, no wheezing, no rhonchi Abdominal: Soft, NT, ND, bowel sounds + Extremities: no edema, no cyanosis    The results of significant diagnostics from this hospitalization (including imaging, microbiology, ancillary and laboratory) are listed below for reference.     Microbiology: Recent Results (from the past 240 hour(s))  Urine Culture     Status: Abnormal   Collection Time: 06/29/23  4:58 PM   Specimen: Urine, Clean Catch  Result Value Ref Range Status   Specimen Description   Final    URINE, CLEAN CATCH Performed at [facility], [address], [place], [place] [address]    Special Requests   Final    NONE Performed at [facility], [address] [place], [place] [address]    Culture >=100,000 COLONIES/mL ESCHERICHIA COLI (A)  Final   Report Status 07/02/2023 FINAL  Final   Organism ID, Bacteria ESCHERICHIA COLI (A)  Final      Susceptibility   Escherichia coli - MIC*    AMPICILLIN >=32 RESISTANT Resistant     CEFAZOLIN <=4 SENSITIVE Sensitive     CEFEPIME <=0.12 SENSITIVE Sensitive     CEFTRIAXONE <=0.25 SENSITIVE Sensitive     CIPROFLOXACIN 0.5 INTERMEDIATE Intermediate     GENTAMICIN <=1 SENSITIVE Sensitive     IMIPENEM <=0.25 SENSITIVE Sensitive     NITROFURANTOIN <=16 SENSITIVE Sensitive     TRIMETH/SULFA <=20 SENSITIVE Sensitive     AMPICILLIN/SULBACTAM 16 INTERMEDIATE Intermediate     PIP/TAZO <=4 SENSITIVE Sensitive     * >=100,000 COLONIES/mL ESCHERICHIA COLI  Blood Culture (routine x 2)     Status: None (Preliminary result)   Collection Time: 06/29/23  6:21 PM   Specimen: BLOOD  Result Value Ref Range Status   Specimen Description BLOOD RIGHT ANTECUBITAL  Final   Special Requests   Final    BOTTLES DRAWN AEROBIC AND ANAEROBIC Blood Culture results may not be optimal due to an inadequate volume of blood received in culture bottles   Culture   Final    NO GROWTH 3 DAYS Performed at [facility], [address] [place], [place] [address]    Report Status PENDING  Incomplete  Resp panel by RT-PCR (RSV, Flu A&B, Covid) Anterior Nasal Swab     Status: None   Collection Time: 06/29/23  6:54 PM   Specimen: Anterior Nasal Swab  Result Value Ref Range Status   SARS Coronavirus 2 by RT PCR NEGATIVE NEGATIVE Final    Comment: (NOTE) SARS-CoV-2 target nucleic acids are NOT DETECTED.  The SARS-CoV-2 RNA is generally detectable in upper respiratory specimens during the acute phase of infection. The lowest concentration of SARS-CoV-2 viral copies this assay can detect is 138 copies/mL. A negative result does not preclude SARS-Cov-2 infection and should not be used as the sole  basis for treatment or other patient management decisions. A negative result may occur with  improper specimen collection/handling, submission of specimen other than nasopharyngeal swab, presence of viral mutation(s) within the areas targeted by this assay, and inadequate number of viral copies(<138 copies/mL). A negative result must be combined with clinical observations, patient history, and epidemiological information. The expected result is Negative.  Fact Sheet for Patients:  BloggerCourse.com  Fact Sheet for Healthcare Providers:  SeriousBroker.it  This test is no t yet approved or cleared by the [place] FDA and  has been authorized for detection and/or diagnosis of SARS-CoV-2 by FDA under an Emergency Use Authorization (EUA). This EUA will remain  in effect (meaning this test can be used) for the duration of the COVID-19 declaration under Section 564(b)(1) of the Act, 21 U.S.C.section 360bbb-3(b)(1), unless the authorization is terminated  or revoked sooner.       Influenza A by PCR NEGATIVE NEGATIVE Final   Influenza B by PCR NEGATIVE NEGATIVE Final    Comment: (NOTE) The Xpert Xpress SARS-CoV-2/FLU/RSV plus assay is intended as an  aid in the diagnosis of influenza from Nasopharyngeal swab specimens and should not be used as a sole basis for treatment. Nasal washings and aspirates are unacceptable for Xpert Xpress SARS-CoV-2/FLU/RSV testing.  Fact Sheet for Patients: BloggerCourse.com  Fact Sheet for Healthcare Providers: SeriousBroker.it  This test is not yet approved or cleared by the [place] FDA and has been authorized for detection and/or diagnosis of SARS-CoV-2 by FDA under an Emergency Use Authorization (EUA). This EUA will remain in effect (meaning this test can be used) for the duration of the COVID-19 declaration under Section 564(b)(1) of the Act, 21 U.S.C. section 360bbb-3(b)(1), unless the authorization is terminated or revoked.     Resp Syncytial Virus by PCR NEGATIVE NEGATIVE Final    Comment: (NOTE) Fact Sheet for Patients: BloggerCourse.com  Fact Sheet for Healthcare Providers: SeriousBroker.it  This test is not yet approved or cleared by the [place] FDA and has been authorized for detection and/or diagnosis of SARS-CoV-2 by FDA under an Emergency Use Authorization (EUA). This EUA will remain in effect (meaning this test can be used) for the duration of the COVID-19 declaration under Section 564(b)(1) of the Act, 21 U.S.C. section 360bbb-3(b)(1), unless the authorization is terminated or revoked.  Performed at [facility], [address] [place], [place] [address]   Blood Culture (routine x 2)     Status: None (Preliminary result)   Collection Time: 06/29/23  7:14 PM   Specimen: BLOOD  Result Value Ref Range Status   Specimen Description BLOOD LEFT ANTECUBITAL  Final   Special Requests   Final    BOTTLES DRAWN AEROBIC AND ANAEROBIC Blood Culture adequate volume   Culture   Final    NO GROWTH 3 DAYS Performed at [facility], [address],  [place], [place] [address]    Report Status PENDING  Incomplete     Labs: BNP (last 3 results) No results for input(s): "BNP" in the last 8760 hours. Basic Metabolic Panel: Recent Labs  Lab 06/29/23 1658 06/30/23 0309 07/01/23 0639 07/02/23 0510  NA 129* 132* 135 137  K 4.1 3.9 4.0 3.9  CL 95* 107 106 108  CO2 20* 19* 18* 20*  GLUCOSE 137* 122* 105* 106*  BUN 38* 37* 34* 32*  CREATININE 2.92* 2.89* 2.60* 2.42*  CALCIUM 8.6* 7.4* 8.1* 8.1*   Liver Function Tests: Recent Labs  Lab 06/29/23 1658  AST  35  ALT 28  ALKPHOS 235*  BILITOT 1.3*  PROT 7.5  ALBUMIN 3.1*   No results for input(s): "LIPASE", "AMYLASE" in the last 168 hours. No results for input(s): "AMMONIA" in the last 168 hours. CBC: Recent Labs  Lab 06/29/23 1658 06/30/23 0309 07/01/23 0639 07/02/23 0510  WBC 17.9* 12.8* 16.6* 14.6*  NEUTROABS 15.0*  --  13.8* 11.4*  HGB 12.5 10.0* 9.5* 9.7*  HCT 37.3 29.3* 28.1* 27.8*  MCV 93.0 93.0 91.5 90.6  PLT 249 205 278 359   Cardiac Enzymes: No results for input(s): "CKTOTAL", "CKMB", "CKMBINDEX", "TROPONINI" in the last 168 hours. BNP: Invalid input(s): "POCBNP" CBG: No results for input(s): "GLUCAP" in the last 168 hours. D-Dimer No results for input(s): "DDIMER" in the last 72 hours. Hgb A1c No results for input(s): "HGBA1C" in the last 72 hours. Lipid Profile No results for input(s): "CHOL", "HDL", "LDLCALC", "TRIG", "CHOLHDL", "LDLDIRECT" in the last 72 hours. Thyroid function studies No results for input(s): "TSH", "T4TOTAL", "T3FREE", "THYROIDAB" in the last 72 hours.  Invalid input(s): "FREET3" Anemia work up No results for input(s): "VITAMINB12", "FOLATE", "FERRITIN", "TIBC", "IRON", "RETICCTPCT" in the last 72 hours. Urinalysis    Component Value Date/Time   COLORURINE AMBER (A) 06/29/2023 1658   APPEARANCEUR TURBID (A) 06/29/2023 1658   LABSPEC 1.015 06/29/2023 1658   PHURINE 5.0 06/29/2023 1658   GLUCOSEU NEGATIVE 06/29/2023 1658    HGBUR SMALL (A) 06/29/2023 1658   BILIRUBINUR NEGATIVE 06/29/2023 1658   BILIRUBINUR Negative 04/28/2022 0911   KETONESUR NEGATIVE 06/29/2023 1658   PROTEINUR >=300 (A) 06/29/2023 1658   UROBILINOGEN 0.2 04/28/2022 0911   NITRITE NEGATIVE 06/29/2023 1658   LEUKOCYTESUR LARGE (A) 06/29/2023 1658   Sepsis Labs Recent Labs  Lab 06/29/23 1658 06/30/23 0309 07/01/23 0639 07/02/23 0510  WBC 17.9* 12.8* 16.6* 14.6*   Microbiology Recent Results (from the past 240 hour(s))  Urine Culture     Status: Abnormal   Collection Time: 06/29/23  4:58 PM   Specimen: Urine, Clean Catch  Result Value Ref Range Status   Specimen Description   Final    URINE, CLEAN CATCH Performed at [facility], [address], [place], [place] [address]    Special Requests   Final    NONE Performed at [facility], [address] [place], [place] [address]    Culture >=100,000 COLONIES/mL ESCHERICHIA COLI (A)  Final   Report Status 07/02/2023 FINAL  Final   Organism ID, Bacteria ESCHERICHIA COLI (A)  Final      Susceptibility   Escherichia coli - MIC*    AMPICILLIN >=32 RESISTANT Resistant     CEFAZOLIN <=4 SENSITIVE Sensitive     CEFEPIME <=0.12 SENSITIVE Sensitive     CEFTRIAXONE <=0.25 SENSITIVE Sensitive     CIPROFLOXACIN 0.5 INTERMEDIATE Intermediate     GENTAMICIN <=1 SENSITIVE Sensitive     IMIPENEM <=0.25 SENSITIVE Sensitive     NITROFURANTOIN <=16 SENSITIVE Sensitive     TRIMETH/SULFA <=20 SENSITIVE Sensitive     AMPICILLIN/SULBACTAM 16 INTERMEDIATE Intermediate     PIP/TAZO <=4 SENSITIVE Sensitive     * >=100,000 COLONIES/mL ESCHERICHIA COLI  Blood Culture (routine x 2)     Status: None (Preliminary result)   Collection Time: 06/29/23  6:21 PM   Specimen: BLOOD  Result Value Ref Range Status   Specimen Description BLOOD RIGHT ANTECUBITAL  Final   Special Requests   Final    BOTTLES DRAWN AEROBIC AND ANAEROBIC Blood Culture results may not be  optimal due to an  inadequate volume of blood received in culture bottles   Culture   Final    NO GROWTH 3 DAYS Performed at [facility], [address] [place], [place] [address]    Report Status PENDING  Incomplete  Resp panel by RT-PCR (RSV, Flu A&B, Covid) Anterior Nasal Swab     Status: None   Collection Time: 06/29/23  6:54 PM   Specimen: Anterior Nasal Swab  Result Value Ref Range Status   SARS Coronavirus 2 by RT PCR NEGATIVE NEGATIVE Final    Comment: (NOTE) SARS-CoV-2 target nucleic acids are NOT DETECTED.  The SARS-CoV-2 RNA is generally detectable in upper respiratory specimens during the acute phase of infection. The lowest concentration of SARS-CoV-2 viral copies this assay can detect is 138 copies/mL. A negative result does not preclude SARS-Cov-2 infection and should not be used as the sole basis for treatment or other patient management decisions. A negative result may occur with  improper specimen collection/handling, submission of specimen other than nasopharyngeal swab, presence of viral mutation(s) within the areas targeted by this assay, and inadequate number of viral copies(<138 copies/mL). A negative result must be combined with clinical observations, patient history, and epidemiological information. The expected result is Negative.  Fact Sheet for Patients:  BloggerCourse.com  Fact Sheet for Healthcare Providers:  SeriousBroker.it  This test is no t yet approved or cleared by the [place] FDA and  has been authorized for detection and/or diagnosis of SARS-CoV-2 by FDA under an Emergency Use Authorization (EUA). This EUA will remain  in effect (meaning this test can be used) for the duration of the COVID-19 declaration under Section 564(b)(1) of the Act, 21 U.S.C.section 360bbb-3(b)(1), unless the authorization is terminated  or revoked sooner.       Influenza A by PCR NEGATIVE NEGATIVE Final    Influenza B by PCR NEGATIVE NEGATIVE Final    Comment: (NOTE) The Xpert Xpress SARS-CoV-2/FLU/RSV plus assay is intended as an aid in the diagnosis of influenza from Nasopharyngeal swab specimens and should not be used as a sole basis for treatment. Nasal washings and aspirates are unacceptable for Xpert Xpress SARS-CoV-2/FLU/RSV testing.  Fact Sheet for Patients: BloggerCourse.com  Fact Sheet for Healthcare Providers: SeriousBroker.it  This test is not yet approved or cleared by the [place] FDA and has been authorized for detection and/or diagnosis of SARS-CoV-2 by FDA under an Emergency Use Authorization (EUA). This EUA will remain in effect (meaning this test can be used) for the duration of the COVID-19 declaration under Section 564(b)(1) of the Act, 21 U.S.C. section 360bbb-3(b)(1), unless the authorization is terminated or revoked.     Resp Syncytial Virus by PCR NEGATIVE NEGATIVE Final    Comment: (NOTE) Fact Sheet for Patients: BloggerCourse.com  Fact Sheet for Healthcare Providers: SeriousBroker.it  This test is not yet approved or cleared by the [place] FDA and has been authorized for detection and/or diagnosis of SARS-CoV-2 by FDA under an Emergency Use Authorization (EUA). This EUA will remain in effect (meaning this test can be used) for the duration of the COVID-19 declaration under Section 564(b)(1) of the Act, 21 U.S.C. section 360bbb-3(b)(1), unless the authorization is terminated or revoked.  Performed at [facility], [address] [place], [place] [address]   Blood Culture (routine x 2)     Status: None (Preliminary result)   Collection Time: 06/29/23  7:14 PM   Specimen: BLOOD  Result Value Ref Range Status   Specimen  Description BLOOD LEFT ANTECUBITAL  Final   Special Requests   Final    BOTTLES DRAWN AEROBIC AND ANAEROBIC  Blood Culture adequate volume   Culture   Final    NO GROWTH 3 DAYS Performed at [facility], [address] [place], [place] [address]    Report Status PENDING  Incomplete     Time coordinating discharge: Over 30 minutes  SIGNED:   Tresa Moore, MD  [facility] 07/02/2023, 1:32 PM Pager   If 7PM-7AM, please contact night-coverage

## 2023-07-02 NOTE — Discharge Instructions (Signed)
Patient should stop her mobic due to AKI. Please ensure plenty of PO hydration. Follow up with PCP in 1 week for repeat CBC, BMP, UA

## 2023-07-02 NOTE — Progress Notes (Signed)
Discharge instructions reviewed with patient including followup visits and new medications/medication changes including discontinuing Mobic.  Advised patient to hydrate well.  Understanding was verbalized and all questions were answered.  IV removed without complication; patient tolerated well.  Patient awaiting ride.

## 2023-08-23 ENCOUNTER — Other Ambulatory Visit: Payer: Self-pay | Admitting: Internal Medicine

## 2023-08-23 DIAGNOSIS — Z1231 Encounter for screening mammogram for malignant neoplasm of breast: Secondary | ICD-10-CM

## 2023-10-13 ENCOUNTER — Ambulatory Visit
Admission: RE | Admit: 2023-10-13 | Discharge: 2023-10-13 | Disposition: A | Discharge status: Home / Self Care | Payer: 59 | Source: Ambulatory Visit | Attending: Internal Medicine | Admitting: Internal Medicine

## 2023-10-13 DIAGNOSIS — Z1231 Encounter for screening mammogram for malignant neoplasm of breast: Secondary | ICD-10-CM | POA: Diagnosis present

## 2024-05-10 ENCOUNTER — Other Ambulatory Visit (HOSPITAL_COMMUNITY)
Admission: RE | Admit: 2024-05-10 | Discharge: 2024-05-10 | Disposition: A | Discharge status: Home / Self Care | Source: Ambulatory Visit | Attending: Obstetrics and Gynecology | Admitting: Obstetrics and Gynecology

## 2024-05-10 ENCOUNTER — Ambulatory Visit (INDEPENDENT_AMBULATORY_CARE_PROVIDER_SITE_OTHER): Admitting: Obstetrics and Gynecology

## 2024-05-10 ENCOUNTER — Encounter: Payer: Self-pay | Admitting: Obstetrics and Gynecology

## 2024-05-10 VITALS — BP 102/69 | HR 69 | Ht 67.0 in | Wt 155.0 lb

## 2024-05-10 DIAGNOSIS — Z01419 Encounter for gynecological examination (general) (routine) without abnormal findings: Secondary | ICD-10-CM

## 2024-05-10 DIAGNOSIS — Z124 Encounter for screening for malignant neoplasm of cervix: Secondary | ICD-10-CM | POA: Insufficient documentation

## 2024-05-10 DIAGNOSIS — Z1151 Encounter for screening for human papillomavirus (HPV): Secondary | ICD-10-CM | POA: Diagnosis present

## 2024-05-10 DIAGNOSIS — Z1231 Encounter for screening mammogram for malignant neoplasm of breast: Secondary | ICD-10-CM

## 2024-05-10 NOTE — Patient Instructions (Signed)
 I value your feedback and you entrusting [place] with your care. If you get a [facility] patient survey, I would appreciate you taking the time to let us know about your experience today. Thank you!  [facility] ([place])--[phone]

## 2024-05-10 NOTE — Progress Notes (Signed)
 PCP: Antonio Baumgarten, MD   Chief Complaint  Patient presents with   Gynecologic Exam    No concerns    HPI:      Ms. Virginia Galvan is a 63 y.o. G0P0000 whose LMP was No LMP recorded. Patient is postmenopausal., presents today for her NP> 3 yrs annual examination.  Her menses are absent due to menopause. No PMB/pelvic pain. She does not have vasomotor sx.   Sex activity: single partner, contraception - post menopausal status. She does not have vaginal dryness/pain/bleeding.  Last Pap: 07/20/2017 Results were: no abnormalities /neg HPV DNA.  Hx of cryotx over 25 yrs ago. Neg paps since.   Last mammogram: 10/13/23 Results were: normal--routine follow-up in 12 months There is a FH of breast cancer in her MGM, genetic testing not indicated. There is no FH of ovarian cancer. The patient does do self-breast exams.  Colonoscopy: 2021 at [facility];  Repeat due after 10 years.   Tobacco use: The patient denies current or previous tobacco use. Alcohol use: social drinker No drug use Exercise: moderately active  She does get adequate calcium and some Vitamin D  in her diet.  Labs with PCP.   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/08/2019    Past Surgical History:  Procedure Laterality Date   BREAST CYST ASPIRATION Left 90s   COLONOSCOPY  06/2010   COLONOSCOPY WITH PROPOFOL  N/A 07/02/2020   Procedure: COLONOSCOPY WITH PROPOFOL ;  Surgeon: Irby Mannan, MD;  Location: [facility] ENDOSCOPY;  Service: Endoscopy;  Laterality: N/A;   OTHER SURGICAL HISTORY     Trigger Finger Surgery Left Hand   TUBAL LIGATION       Family History  Problem Relation Age of Onset   Asthma Mother    COPD Mother    Kidney disease Mother    Heart attack Father    Breast cancer Maternal Grandmother        late years   Colon cancer Paternal Grandfather        late years    Social History   Socioeconomic History   Marital status: Widowed    Spouse name: Not on file   Number of children: Not on file   Years of education: Not on file   Highest education level: Not on file  Occupational History   Not on file  Tobacco Use   Smoking status: Former    Types: Cigarettes   Smokeless tobacco: Never  Vaping Use   Vaping status: Never Used  Substance and Sexual Activity   Alcohol use: Yes    Alcohol/week: 6.0 standard drinks of alcohol    Types: 6 Cans of beer per week    Comment: soc   Drug use: No   Sexual activity: Yes    Birth control/protection: Post-menopausal  Other Topics Concern   Not on file  Social History Narrative   Not on file   Social Drivers of Health   Financial Resource Strain: Low Risk  (04/03/2024)   Received from [facility]   Overall Financial Resource Strain (CARDIA)    Difficulty of Paying Living  Expenses: Not very hard  Food Insecurity: Patient Declined (04/03/2024)   Received from [facility]   Hunger Vital Sign    Worried About Running Out of Food in the Last Year: Patient declined    Ran Out of Food in the Last Year: Patient declined  Transportation Needs: No Transportation Needs (04/03/2024)   Received from [facility] - Transportation    In the past 12 months, has lack of transportation kept you from medical appointments or from getting medications?: No    Lack of Transportation (Non-Medical): No  Physical Activity: Unknown (05/28/2020)   Exercise Vital Sign    Days of Exercise per Week: 4 days    Minutes of Exercise per Session: Not on file  Stress: Not on file  Social Connections: Not on file  Intimate  Partner Violence: Not At Risk (06/29/2023)   Humiliation, Afraid, Rape, and Kick questionnaire    Fear of Current or Ex-Partner: No    Emotionally Abused: No    Physically Abused: No    Sexually Abused: No     Current Outpatient Medications:    clobetasol  ointment (TEMOVATE ) 0.05 %, PLEASE SEE ATTACHED FOR DETAILED DIRECTIONS, Disp: , Rfl:    Cyanocobalamin  (VITAMIN B-12 PO), Take by mouth., Disp: , Rfl:    hydrocortisone butyrate (LUCOID) 0.1 % CREA cream, Apply topically., Disp: , Rfl:    meloxicam  (MOBIC ) 15 MG tablet, TAKE 1 TABLET (15 MG TOTAL) BY MOUTH EVERY DAY, Disp: , Rfl:    Multiple Vitamin (MULTI-VITAMIN) tablet, Take by mouth., Disp: , Rfl:    Zinc  Sulfate 220 (50 Zn) MG TABS, Take 1 tablet by mouth., Disp: , Rfl:      ROS:  Review of Systems  Constitutional:  Negative for fatigue, fever and unexpected weight change.  Respiratory:  Negative for cough, shortness of breath and wheezing.   Cardiovascular:  Negative for chest pain, palpitations and leg swelling.  Gastrointestinal:  Negative for blood in stool, constipation, diarrhea, nausea and vomiting.  Endocrine: Negative for cold intolerance, heat intolerance and polyuria.  Genitourinary:  Negative for dyspareunia, dysuria, flank pain, frequency, genital sores, hematuria, menstrual problem, pelvic pain, urgency, vaginal bleeding, vaginal discharge and vaginal pain.  Musculoskeletal:  Negative for back pain, joint swelling and myalgias.  Skin:  Negative for rash.  Neurological:  Negative for dizziness, syncope, light-headedness, numbness and headaches.  Hematological:  Negative for adenopathy.  Psychiatric/Behavioral:  Negative for agitation, confusion, sleep disturbance and suicidal ideas. The patient is not nervous/anxious.    BREAST: No symptoms    Objective: BP 102/69   Pulse 69   Ht 5\' 7"  (1.702 m)   Wt 155 lb (70.3 kg)   BMI 24.28 kg/m    Physical Exam Constitutional:      Appearance: She is  well-developed.  Genitourinary:     Vulva normal.     Right Labia: No rash, tenderness or lesions.    Left Labia: No tenderness, lesions or rash.    No vaginal discharge, erythema or tenderness.      Right Adnexa: not tender and no mass present.    Left Adnexa: not tender and no mass present.    No cervical friability or polyp.     Uterus is not enlarged or tender.  Breasts:    Right: No mass, nipple discharge, skin change or tenderness.     Left: No mass, nipple discharge, skin change or tenderness.  Neck:     Thyroid: No  thyromegaly.  Cardiovascular:     Rate and Rhythm: Normal rate and regular rhythm.     Heart sounds: Normal heart sounds. No murmur heard. Pulmonary:     Effort: Pulmonary effort is normal.     Breath sounds: Normal breath sounds.  Abdominal:     Palpations: Abdomen is soft.     Tenderness: There is no abdominal tenderness. There is no guarding or rebound.  Musculoskeletal:        General: Normal range of motion.     Cervical back: Normal range of motion.  Lymphadenopathy:     Cervical: No cervical adenopathy.  Neurological:     General: No focal deficit present.     Mental Status: She is alert and oriented to person, place, and time.     Cranial Nerves: No cranial nerve deficit.  Skin:    General: Skin is warm and dry.  Psychiatric:        Mood and Affect: Mood normal.        Behavior: Behavior normal.        Thought Content: Thought content normal.        Judgment: Judgment normal.  Vitals reviewed.     Assessment/Plan:  Encounter for annual routine gynecological examination  Cervical cancer screening - Plan: Cytology - PAP  Screening for HPV (human papillomavirus) - Plan: Cytology - PAP  Encounter for screening mammogram for malignant neoplasm of breast - Plan: MM 3D SCREENING MAMMOGRAM BILATERAL BREAST; pt to schedule mammo         GYN counsel breast self exam, mammography screening, menopause, adequate intake of calcium and vitamin D ,  diet and exercise    F/U  Return in about 1 year (around 05/10/2025).  Datra Clary B. Versie Fleener, PA-C 05/10/2024 2:47 PM

## 2024-05-14 ENCOUNTER — Ambulatory Visit: Payer: Self-pay | Admitting: Obstetrics and Gynecology

## 2024-05-14 LAB — CYTOLOGY - PAP
Adequacy: ABSENT
Comment: NEGATIVE
Diagnosis: UNDETERMINED — AB
High risk HPV: POSITIVE — AB

## 2024-07-18 ENCOUNTER — Other Ambulatory Visit: Payer: Self-pay | Admitting: Specialist

## 2024-07-18 ENCOUNTER — Other Ambulatory Visit: Payer: Self-pay

## 2024-07-18 ENCOUNTER — Encounter
Admission: RE | Admit: 2024-07-18 | Discharge: 2024-07-18 | Disposition: A | Discharge status: Home / Self Care | Source: Ambulatory Visit | Attending: Specialist | Admitting: Specialist

## 2024-07-18 HISTORY — DX: Carpal tunnel syndrome, right upper limb: G56.01

## 2024-07-18 NOTE — H&P (Signed)
 PREOPERATIVE H&P  Chief Complaint: G56.01 Carpal tunnel syndrome, right upper limb right CTR  HPI: Virginia Galvan is a 63 y.o. female who presents for preoperative history and physical with a diagnosis of G56.01 Carpal tunnel syndrome, right upper limb right CTR. Symptoms are rated as moderate to severe, and have been worsening.  She has failed conservative treatment ith injection, medications, and splinting. This is significantly impairing activities of daily living.  She has elected for surgical management.   Past Medical History:  Diagnosis Date   Anxiety    Arthritis    Carpal tunnel syndrome of right wrist    Psoriasis    Past Surgical History:  Procedure Laterality Date   BREAST CYST ASPIRATION Left 90s   COLONOSCOPY  06/2010   COLONOSCOPY WITH PROPOFOL  N/A 07/02/2020   Procedure: COLONOSCOPY WITH PROPOFOL ;  Surgeon: Janalyn Keene NOVAK, MD;  Location: [facility] ENDOSCOPY;  Service: Endoscopy;  Laterality: N/A;   OTHER SURGICAL HISTORY     Trigger Finger Surgery Left Hand   TUBAL LIGATION     Social History   Socioeconomic History   Marital status: Widowed    Spouse name: Not on file   Number of children: Not on file   Years of education: Not on file   Highest education level: Not on file  Occupational History   Not on file  Tobacco Use   Smoking status: Former    Types: Cigarettes   Smokeless tobacco: Never  Vaping Use   Vaping status: Never Used  Substance and Sexual Activity   Alcohol use: Yes    Alcohol/week: 6.0 standard drinks of alcohol    Types: 6 Cans of beer per week    Comment: soc   Drug use: No   Sexual activity: Yes    Birth control/protection: Post-menopausal  Other Topics Concern   Not on file  Social History Narrative   Lives alone   Social Drivers of Health   Financial Resource Strain: Low Risk  (04/03/2024)   Received from [facility]   Overall Financial Resource Strain (CARDIA)    Difficulty of Paying Living Expenses:  Not very hard  Food Insecurity: Patient Declined (04/03/2024)   Received from [facility]   Hunger Vital Sign    Within the past 12 months, you worried that your food would run out before you got the money to buy more.: Patient declined    Within the past 12 months, the food you bought just didn't last and you didn't have money to get more.: Patient declined  Transportation Needs: No Transportation Needs (04/03/2024)   Received from [facility] - Transportation    In the past 12 months, has lack of transportation kept you from medical appointments or from getting medications?: No    Lack of Transportation (Non-Medical): No  Physical Activity: Unknown (05/28/2020)   Exercise Vital Sign    Days of Exercise per Week: 4 days    Minutes of Exercise per Session: Not on file  Stress: Not on file  Social Connections: Not on file   Family History  Problem Relation Age of Onset   Asthma Mother    COPD Mother    Kidney disease Mother    Heart attack Father    Breast cancer Maternal Grandmother        late years   Colon cancer Paternal Grandfather        late years   No Known Allergies Prior to  Admission medications   Medication Sig Start Date End Date Taking? Authorizing Provider  clobetasol  ointment (TEMOVATE ) 0.05 % PLEASE SEE ATTACHED FOR DETAILED DIRECTIONS    [provider]  Cyanocobalamin  (VITAMIN B-12 PO) Take by mouth.    [provider]  hydrocortisone butyrate (LUCOID) 0.1 % CREA cream Apply topically. 03/14/24   [provider]  meloxicam  (MOBIC ) 15 MG tablet TAKE 1 TABLET (15 MG TOTAL) BY MOUTH EVERY DAY Patient not taking: Reported on 07/18/2024    [provider]  Multiple Vitamin (MULTI-VITAMIN) tablet Take by mouth.    [provider]  Zinc  Sulfate 220 (50 Zn) MG TABS Take 1 tablet by mouth.    [provider]     Positive ROS: All other systems have been reviewed and were  otherwise negative with the exception of those mentioned in the HPI and as above.  Physical Exam: General: Alert, no acute distress Cardiovascular: No pedal edema. Heart is regular and without murmur.  Respiratory: No cyanosis, no use of accessory musculature. Lungs are clear. GI: No organomegaly, abdomen is soft and non-tender Skin: No lesions in the area of chief complaint Neurologic: Sensation intact distally Psychiatric: Patient is competent for consent with normal mood and affect Lymphatic: No axillary or cervical lymphadenopathy  MUSCULOSKELETAL: Right hand shows some devrease in sensation. Grip is good. Positive median nerve compression test. Pinch is weak.   Assessment: G56.01 Carpal tunnel syndrome, right upper limb right CTR  Plan: Plan for Procedure(s): CARPAL TUNNEL RELEASE  The risks benefits and alternatives were discussed with the patient including but not limited to the risks of nonoperative treatment, versus surgical intervention including infection, bleeding, nerve injury,  blood clots, cardiopulmonary complications, morbidity, mortality, among others, and they were willing to proceed.   Kayla FORBES Pinal, MD [phone]   07/18/2024 11:01 PM

## 2024-07-18 NOTE — Patient Instructions (Addendum)
 Your procedure is scheduled on: 07/26/24 - Thursday Report to the Registration Desk on the 1st floor of the [facility]. To find out your arrival time, please call [phone] between 1PM - 3PM on: 07/25/24 - Wednesday If your arrival time is 6:00 am, do not arrive before that time as the Medical Mall entrance doors do not open until 6:00 am.  REMEMBER: Instructions that are not followed completely may result in serious medical risk, up to and including death; or upon the discretion of your surgeon and anesthesiologist your surgery may need to be rescheduled.  Do not eat food after midnight the night before surgery.  No gum chewing or hard candies.  You may however, drink CLEAR liquids up to 2 hours before you are scheduled to arrive for your surgery. Do not drink anything within 2 hours of your scheduled arrival time.  Clear liquids include: - water  - apple juice without pulp - gatorade (not RED colors) - black coffee or tea (Do NOT add milk or creamers to the coffee or tea) Do NOT drink anything that is not on this list.  One week prior to surgery: Stop Anti-inflammatories (NSAIDS) such as Advil, Aleve, Ibuprofen, Motrin, Naproxen, Naprosyn and Aspirin based products such as Excedrin, Goody's Powder, BC Powder. You may take Tylenol  if needed for pain up until the day of surgery.  Stop ANY OVER THE COUNTER supplements until after surgery.  ON THE DAY OF SURGERY ONLY TAKE THESE MEDICATIONS WITH SIPS OF WATER:  none   No Alcohol for 24 hours before or after surgery.  No Smoking including e-cigarettes for 24 hours before surgery.  No chewable tobacco products for at least 6 hours before surgery.  No nicotine patches on the day of surgery.  Do not use any recreational drugs for at least a week (preferably 2 weeks) before your surgery.  Please be advised that the combination of cocaine and anesthesia may have negative outcomes, up to and including death. If you test positive  for cocaine, your surgery will be cancelled.  On the morning of surgery brush your teeth with toothpaste and water, you may rinse your mouth with mouthwash if you wish. Do not swallow any toothpaste or mouthwash.  Use CHG Soap or wipes as directed on instruction sheet.  Do not wear jewelry, make-up, hairpins, clips or nail polish.  For welded (permanent) jewelry: bracelets, anklets, waist bands, etc.  Please have this removed prior to surgery.  If it is not removed, there is a chance that hospital personnel will need to cut it off on the day of surgery.  Do not wear lotions, powders, or perfumes.   Do not shave body hair from the neck down 48 hours before surgery.  Contact lenses, hearing aids and dentures may not be worn into surgery.  Do not bring valuables to the hospital. [facility] is not responsible for any missing/lost belongings or valuables.   Notify your doctor if there is any change in your medical condition (cold, fever, infection).  Wear comfortable clothing (specific to your surgery type) to the hospital.  After surgery, you can help prevent lung complications by doing breathing exercises.  Take deep breaths and cough every 1-2 hours. Your doctor may order a device called an Incentive Spirometer to help you take deep breaths.  When coughing or sneezing, hold a pillow firmly against your incision with both hands. This is called "splinting." Doing this helps protect your incision. It also decreases belly discomfort.  If  you are being admitted to the hospital overnight, leave your suitcase in the car. After surgery it may be brought to your room.  In case of increased patient census, it may be necessary for you, the patient, to continue your postoperative care in the Same Day Surgery department.  If you are being discharged the day of surgery, you will not be allowed to drive home. You will need a responsible individual to drive you home and stay with you for 24 hours  after surgery.   If you are taking public transportation, you will need to have a responsible individual with you.  Please call the Pre-admissions Testing Dept. at [phone] if you have any questions about these instructions.  Surgery Visitation Policy:  Patients having surgery or a procedure may have two visitors.  Children under the age of 63 must have an adult with them who is not the patient.  Inpatient Visitation:    Visiting hours are 7 a.m. to 8 p.m. Up to four visitors are allowed at one time in a patient room. The visitors may rotate out with other people during the day.  One visitor age 37 or older may stay with the patient overnight and must be in the room by 8 p.m.   Merchandiser, retail to address health-related social needs:  [email or web address]    Preparing for Surgery with CHLORHEXIDINE  GLUCONATE (CHG) Soap  Chlorhexidine  Gluconate (CHG) Soap  o An antiseptic cleaner that kills germs and bonds with the skin to continue killing germs even after washing  o Used for showering the night before surgery and morning of surgery  Before surgery, you can play an important role by reducing the number of germs on your skin.  CHG (Chlorhexidine  gluconate) soap is an antiseptic cleanser which kills germs and bonds with the skin to continue killing germs even after washing.  Please do not use if you have an allergy to CHG or antibacterial soaps. If your skin becomes reddened/irritated stop using the CHG.  1. Shower the NIGHT BEFORE SURGERY and the MORNING OF SURGERY with CHG soap.  2. If you choose to wash your hair, wash your hair first as usual with your normal shampoo.  3. After shampooing, rinse your hair and body thoroughly to remove the shampoo.  4. Use CHG as you would any other liquid soap. You can apply CHG directly to the skin and wash gently with a scrungie or a clean washcloth.  5. Apply the CHG soap to your body only from the neck  down. Do not use on open wounds or open sores. Avoid contact with your eyes, ears, mouth, and genitals (private parts). Wash face and genitals (private parts) with your normal soap.  6. Wash thoroughly, paying special attention to the area where your surgery will be performed.  7. Thoroughly rinse your body with warm water.  8. Do not shower/wash with your normal soap after using and rinsing off the CHG soap.  9. Pat yourself dry with a clean towel.  10. Wear clean pajamas to bed the night before surgery.  12. Place clean sheets on your bed the night of your first shower and do not sleep with pets.  13. Shower again with the CHG soap on the day of surgery prior to arriving at the hospital.  14. Do not apply any deodorants/lotions/powders.  15. Please wear clean clothes to the hospital.

## 2024-07-26 ENCOUNTER — Ambulatory Visit: Payer: Self-pay | Admitting: Urgent Care

## 2024-07-26 ENCOUNTER — Other Ambulatory Visit: Payer: Self-pay

## 2024-07-26 ENCOUNTER — Ambulatory Visit: Payer: Self-pay | Admitting: Anesthesiology

## 2024-07-26 ENCOUNTER — Encounter
Admission: RE | Disposition: A | Discharge status: Home / Self Care | Payer: Self-pay | Source: Home / Self Care | Attending: Specialist

## 2024-07-26 ENCOUNTER — Encounter: Payer: Self-pay | Admitting: Specialist

## 2024-07-26 ENCOUNTER — Ambulatory Visit
Admission: RE | Admit: 2024-07-26 | Discharge: 2024-07-26 | Disposition: A | Discharge status: Home / Self Care | Attending: Specialist | Admitting: Specialist

## 2024-07-26 DIAGNOSIS — M1990 Unspecified osteoarthritis, unspecified site: Secondary | ICD-10-CM | POA: Insufficient documentation

## 2024-07-26 DIAGNOSIS — Z87891 Personal history of nicotine dependence: Secondary | ICD-10-CM | POA: Diagnosis not present

## 2024-07-26 DIAGNOSIS — G5601 Carpal tunnel syndrome, right upper limb: Secondary | ICD-10-CM | POA: Insufficient documentation

## 2024-07-26 SURGERY — CARPAL TUNNEL RELEASE
Anesthesia: General | Site: Wrist | Laterality: Right

## 2024-07-26 MED ORDER — PHENYLEPHRINE 80 MCG/ML (10ML) SYRINGE FOR IV PUSH (FOR BLOOD PRESSURE SUPPORT)
PREFILLED_SYRINGE | INTRAVENOUS | Status: DC | PRN
Start: 2024-07-26 — End: 2024-07-26
  Administered 2024-07-26: 80 ug via INTRAVENOUS

## 2024-07-26 MED ORDER — FENTANYL CITRATE (PF) 100 MCG/2ML IJ SOLN
INTRAMUSCULAR | Status: DC | PRN
  Administered 2024-07-26 (×2): 25 ug via INTRAVENOUS
  Administered 2024-07-26: 50 ug via INTRAVENOUS

## 2024-07-26 MED ORDER — MELOXICAM 15 MG PO TABS: 15.0000 mg | ORAL_TABLET | Freq: Every day | ORAL | 3 refills | Status: AC

## 2024-07-26 MED ORDER — 0.9 % SODIUM CHLORIDE (POUR BTL) OPTIME
TOPICAL | Status: DC | PRN
  Administered 2024-07-26: 120 mL

## 2024-07-26 MED ORDER — MIDAZOLAM HCL 2 MG/2ML IJ SOLN
INTRAMUSCULAR | Status: AC
  Filled 2024-07-26: qty 2

## 2024-07-26 MED ORDER — GABAPENTIN 300 MG PO CAPS
ORAL_CAPSULE | ORAL | Status: AC
Start: 2024-07-26 — End: 2024-07-26
  Filled 2024-07-26: qty 1

## 2024-07-26 MED ORDER — OXYCODONE HCL 5 MG PO TABS: 5.0000 mg | ORAL_TABLET | Freq: Once | ORAL | Status: DC | PRN

## 2024-07-26 MED ORDER — OXYCODONE HCL 5 MG/5ML PO SOLN: 5.0000 mg | Freq: Once | ORAL | Status: DC | PRN

## 2024-07-26 MED ORDER — CHLORHEXIDINE GLUCONATE CLOTH 2 % EX PADS: 6.0000 | MEDICATED_PAD | Freq: Once | CUTANEOUS | Status: DC

## 2024-07-26 MED ORDER — MIDAZOLAM HCL 2 MG/2ML IJ SOLN
INTRAMUSCULAR | Status: DC | PRN
  Administered 2024-07-26: 1 mg via INTRAVENOUS

## 2024-07-26 MED ORDER — PROPOFOL 10 MG/ML IV BOLUS
INTRAVENOUS | Status: AC
  Filled 2024-07-26: qty 20

## 2024-07-26 MED ORDER — LIDOCAINE HCL (PF) 2 % IJ SOLN
INTRAMUSCULAR | Status: AC
Start: 2024-07-26 — End: 2024-07-26
  Filled 2024-07-26: qty 5

## 2024-07-26 MED ORDER — TRAMADOL HCL 50 MG PO TABS: 50.0000 mg | ORAL_TABLET | Freq: Four times a day (QID) | ORAL | 2 refills | Status: AC | PRN

## 2024-07-26 MED ORDER — CELECOXIB 200 MG PO CAPS
200.0000 mg | ORAL_CAPSULE | ORAL | Status: AC
  Administered 2024-07-26: 200 mg via ORAL

## 2024-07-26 MED ORDER — GABAPENTIN 400 MG PO CAPS
400.0000 mg | ORAL_CAPSULE | Freq: Three times a day (TID) | ORAL | 3 refills | Status: AC
Start: 2024-07-26 — End: ?

## 2024-07-26 MED ORDER — LIDOCAINE HCL (CARDIAC) PF 100 MG/5ML IV SOSY
PREFILLED_SYRINGE | INTRAVENOUS | Status: DC | PRN
  Administered 2024-07-26: 60 mg via INTRAVENOUS

## 2024-07-26 MED ORDER — ORAL CARE MOUTH RINSE: 15.0000 mL | Freq: Once | OROMUCOSAL | Status: AC

## 2024-07-26 MED ORDER — FENTANYL CITRATE (PF) 100 MCG/2ML IJ SOLN
INTRAMUSCULAR | Status: AC
  Filled 2024-07-26: qty 2

## 2024-07-26 MED ORDER — EPHEDRINE SULFATE-NACL 50-0.9 MG/10ML-% IV SOSY
PREFILLED_SYRINGE | INTRAVENOUS | Status: DC | PRN
  Administered 2024-07-26: 10 mg via INTRAVENOUS
  Administered 2024-07-26: 5 mg via INTRAVENOUS

## 2024-07-26 MED ORDER — CEFAZOLIN SODIUM-DEXTROSE 2-4 GM/100ML-% IV SOLN
2.0000 g | INTRAVENOUS | Status: AC
  Administered 2024-07-26: 2 g via INTRAVENOUS

## 2024-07-26 MED ORDER — CEFAZOLIN SODIUM-DEXTROSE 2-4 GM/100ML-% IV SOLN
INTRAVENOUS | Status: AC
  Filled 2024-07-26: qty 100

## 2024-07-26 MED ORDER — BUPIVACAINE HCL 0.5 % IJ SOLN
INTRAMUSCULAR | Status: DC | PRN
  Administered 2024-07-26: 17 mL

## 2024-07-26 MED ORDER — DEXAMETHASONE SODIUM PHOSPHATE 10 MG/ML IJ SOLN
INTRAMUSCULAR | Status: DC | PRN
  Administered 2024-07-26: 5 mg via INTRAVENOUS

## 2024-07-26 MED ORDER — GABAPENTIN 300 MG PO CAPS
300.0000 mg | ORAL_CAPSULE | ORAL | Status: AC
  Administered 2024-07-26: 300 mg via ORAL

## 2024-07-26 MED ORDER — FENTANYL CITRATE (PF) 100 MCG/2ML IJ SOLN: 25.0000 ug | INTRAMUSCULAR | Status: DC | PRN

## 2024-07-26 MED ORDER — ONDANSETRON HCL 4 MG/2ML IJ SOLN
INTRAMUSCULAR | Status: DC | PRN
  Administered 2024-07-26: 4 mg via INTRAVENOUS

## 2024-07-26 MED ORDER — PROPOFOL 10 MG/ML IV BOLUS
INTRAVENOUS | Status: DC | PRN
  Administered 2024-07-26: 150 mg via INTRAVENOUS
  Administered 2024-07-26: 20 mg via INTRAVENOUS

## 2024-07-26 MED ORDER — CHLORHEXIDINE GLUCONATE 0.12 % MT SOLN
15.0000 mL | Freq: Once | OROMUCOSAL | Status: AC
  Administered 2024-07-26: 15 mL via OROMUCOSAL

## 2024-07-26 MED ORDER — CELECOXIB 200 MG PO CAPS
ORAL_CAPSULE | ORAL | Status: AC
  Filled 2024-07-26: qty 1

## 2024-07-26 MED ORDER — BUPIVACAINE HCL (PF) 0.5 % IJ SOLN
INTRAMUSCULAR | Status: AC
  Filled 2024-07-26: qty 30

## 2024-07-26 MED ORDER — CHLORHEXIDINE GLUCONATE 0.12 % MT SOLN
OROMUCOSAL | Status: AC
  Filled 2024-07-26: qty 15

## 2024-07-26 MED ORDER — LACTATED RINGERS IV SOLN: INTRAVENOUS | Status: DC

## 2024-07-26 SURGICAL SUPPLY — 22 items
BLADE SURG MINI STRL (BLADE) ×1 IMPLANT
BNDG ESMARCH 4X12 STRL LF (GAUZE/BANDAGES/DRESSINGS) ×1 IMPLANT
CHLORAPREP W/TINT 26 (MISCELLANEOUS) ×1 IMPLANT
CUFF TOURN SGL QUICK 18X4 (TOURNIQUET CUFF) IMPLANT
DRSG GAUZE FLUFF 36X18 (GAUZE/BANDAGES/DRESSINGS) ×2 IMPLANT
ELECTRODE REM PT RTRN 9FT ADLT (ELECTROSURGICAL) ×1 IMPLANT
GAUZE XEROFORM 1X8 LF (GAUZE/BANDAGES/DRESSINGS) ×1 IMPLANT
GLOVE BIO SURGEON STRL SZ8 (GLOVE) ×1 IMPLANT
GOWN STRL REUS W/ TWL LRG LVL3 (GOWN DISPOSABLE) ×1 IMPLANT
GOWN STRL REUS W/TWL LRG LVL4 (GOWN DISPOSABLE) ×1 IMPLANT
KIT TURNOVER KIT A (KITS) ×1 IMPLANT
MANIFOLD NEPTUNE II (INSTRUMENTS) ×1 IMPLANT
NS IRRIG 500ML POUR BTL (IV SOLUTION) ×1 IMPLANT
PACK EXTREMITY ARMC (MISCELLANEOUS) ×1 IMPLANT
PAD PREP OB/GYN DISP 24X41 (PERSONAL CARE ITEMS) ×1 IMPLANT
PADDING CAST BLEND 4X4 STRL (MISCELLANEOUS) ×1 IMPLANT
PENCIL SMOKE EVACUATOR (MISCELLANEOUS) ×1 IMPLANT
SPLINT CAST 1 STEP 3X12 (MISCELLANEOUS) ×1 IMPLANT
STOCKINETTE 48X4 2 PLY STRL (GAUZE/BANDAGES/DRESSINGS) ×1 IMPLANT
STOCKINETTE BIAS CUT 4 980044 (GAUZE/BANDAGES/DRESSINGS) ×1 IMPLANT
STOCKINETTE STRL 4IN 9604848 (GAUZE/BANDAGES/DRESSINGS) ×1 IMPLANT
SUTURE ETHLN 4-0 FS2 18XMF BLK (SUTURE) ×1 IMPLANT

## 2024-07-26 NOTE — Anesthesia Preprocedure Evaluation (Signed)
 Anesthesia Evaluation  Patient identified by MRN, date of birth, ID band Patient awake    Reviewed: Allergy & Precautions, NPO status , Patient's Chart, lab work & pertinent test results  History of Anesthesia Complications Negative for: history of anesthetic complications  Airway Mallampati: III  TM Distance: >3 FB Neck ROM: full    Dental no notable dental hx.    Pulmonary neg pulmonary ROS, former smoker   Pulmonary exam normal        Cardiovascular negative cardio ROS Normal cardiovascular exam     Neuro/Psych  PSYCHIATRIC DISORDERS Anxiety      Neuromuscular disease    GI/Hepatic negative GI ROS, Neg liver ROS,,,  Endo/Other  negative endocrine ROS    Renal/GU      Musculoskeletal  (+) Arthritis ,    Abdominal   Peds  Hematology negative hematology ROS (+)   Anesthesia Other Findings Past Medical History: No date: Anxiety No date: Arthritis No date: Carpal tunnel syndrome of right wrist No date: Psoriasis  Past Surgical History: 90s: BREAST CYST ASPIRATION; Left 06/2010: COLONOSCOPY 07/02/2020: COLONOSCOPY WITH PROPOFOL ; N/A     Comment:  Procedure: COLONOSCOPY WITH PROPOFOL ;  Surgeon:               Janalyn Keene NOVAK, MD;  Location: [facility] END[facility];                Service: Endoscopy;  Laterality: N/A; No date: OTHER SURGICAL HISTORY     Comment:  Trigger Finger Surgery Left Hand No date: TUBAL LIGATION     Reproductive/Obstetrics negative OB ROS                              Anesthesia Physical Anesthesia Plan  ASA: 1  Anesthesia Plan: General LMA   Post-op Pain Management: Celebrex  PO (pre-op)*, Gabapentin  PO (pre-op)* and Ofirmev  IV (intra-op)*   Induction: Intravenous  PONV Risk Score and Plan: 3 and Dexamethasone , Ondansetron , Midazolam  and Treatment may vary due to age or medical condition  Airway Management Planned: LMA  Additional Equipment:    Intra-op Plan:   Post-operative Plan: Extubation in OR  Informed Consent: I have reviewed the patients History and Physical, chart, labs and discussed the procedure including the risks, benefits and alternatives for the proposed anesthesia with the patient or authorized representative who has indicated his/her understanding and acceptance.     Dental Advisory Given  Plan Discussed with: Anesthesiologist, CRNA and Surgeon  Anesthesia Plan Comments: (Patient consented for risks of anesthesia including but not limited to:  - adverse reactions to medications - damage to eyes, teeth, lips or other oral mucosa - nerve damage due to positioning  - sore throat or hoarseness - Damage to heart, brain, nerves, lungs, other parts of body or loss of life  Patient voiced understanding and assent.)        Anesthesia Quick Evaluation

## 2024-07-26 NOTE — Op Note (Signed)
 07/26/2024  8:42 AM  PATIENT:  Virginia Galvan    PRE-OPERATIVE DIAGNOSIS: RIGHT CARPAL TUNNEL SYNDROME POST-OPERATIVE DIAGNOSIS: RIGHT CARPAL TUNNEL SYNDROME  PROCEDURE:  RIGHT CARPAL TUNNEL RELEASE  SURGEON: KAYLA FORBES PINAL, MD    ANESTHESIA:   General  TOURNIQUET TIME: 19   MIN  PREOPERATIVE INDICATIONS:  [name] is a  63 y.o. female with a diagnosis of right carpal tunnel syndrome who failed conservative measures and elected for surgical management.    The risks benefits and alternatives were discussed with the patient preoperatively including but not limited to the risks of infection, bleeding, nerve injury, incomplete relief of symptoms, pillar pain, cardiopulmonary complications, the need for revision surgery, among others, and the patient was willing to proceed.  OPERATIVE FINDINGS: Thickened volar ligament and nerve compression.Significant tenosynovitis.  OPERATIVE PROCEDURE: The patient is brought to the operating room placed in the supine position. General anesthesia was administered. The right upper extremity was prepped and draped in usual sterile fashion. Time out was performed. The arm was elevated and exsanguinated and the tourniquet was inflated. Incision was made in line with the radial border of the ring finger. The carpal tunnel transverse fascia was identified, cleaned, and incised sharply. The common sensory branches were visualized along with the superficial palmar arch and protected.  The median nerve was protected below  A Kelly clamp was  placed underneath the transverse carpal ligament, protecting the nerve. I released the ligament completely, and then released the proximal distal volar forearm fascia. The nerve was identified, and visualized, and protected throughout the case. The motor branch was intact upon inspection.  No masses or abnormalities were identified in ulnar bursa.  The wounds were irrigated copiously, and the skin closed with nylon. The wound  was injected with 1/2% marcaine  followed by a sterile dressing and  volar splint .  Tourniquet was deflated with good return of blood flow to all fingers. Sponge and needle counts were correct.  The patient tolerated this well, with no complications. The patient was awakened and taken to recovery in good condition.

## 2024-07-26 NOTE — H&P (Signed)
THE PATIENT WAS SEEN PRIOR TO SURGERY TODAY.  HISTORY, ALLERGIES, HOME MEDICATIONS AND OPERATIVE PROCEDURE WERE REVIEWED. RISKS AND BENEFITS OF SURGERY DISCUSSED WITH PATIENT AGAIN.  NO CHANGES FROM INITIAL HISTORY AND PHYSICAL NOTED.    

## 2024-07-26 NOTE — Transfer of Care (Signed)
 Immediate Anesthesia Transfer of Care Note  Patient: Virginia Galvan  Procedure(s) Performed: CARPAL TUNNEL RELEASE (Right: Wrist)  Patient Location: PACU  Anesthesia Type:General  Level of Consciousness: awake, alert , and oriented  Airway & Oxygen Therapy: Patient Spontanous Breathing  Post-op Assessment: Report given to RN and Post -op Vital signs reviewed and stable  Post vital signs: stable  Last Vitals:  Vitals Value Taken Time  BP 120/58 07/26/24 08:46  Temp 36.3 C 07/26/24 08:46  Pulse 76 07/26/24 08:50  Resp 10 07/26/24 08:50  SpO2 99 % 07/26/24 08:50  Vitals shown include unfiled device data.  Last Pain:  Vitals:   07/26/24 0846  TempSrc:   PainSc: 0-No pain         Complications: No notable events documented.

## 2024-07-27 ENCOUNTER — Encounter: Payer: Self-pay | Admitting: Specialist

## 2024-08-05 NOTE — Anesthesia Postprocedure Evaluation (Signed)
 Anesthesia Post Note  Patient: Virginia Galvan  Procedure(s) Performed: CARPAL TUNNEL RELEASE (Right: Wrist)  Patient location during evaluation: PACU Anesthesia Type: General Level of consciousness: awake and alert Pain management: pain level controlled Vital Signs Assessment: post-procedure vital signs reviewed and stable Respiratory status: spontaneous breathing, nonlabored ventilation, respiratory function stable and patient connected to nasal cannula oxygen Cardiovascular status: blood pressure returned to baseline and stable Postop Assessment: no apparent nausea or vomiting Anesthetic complications: no   No notable events documented.   Last Vitals:  Vitals:   07/26/24 0915 07/26/24 0924  BP: 124/79 128/71  Pulse: 70 69  Resp: 12 16  Temp: (!) 36.1 C (!) 36.1 C  SpO2: 100% 100%    Last Pain:  Vitals:   07/26/24 0924  TempSrc: Temporal  PainSc: 0-No pain                 Prentice Murphy

## 2024-10-16 ENCOUNTER — Ambulatory Visit
Admission: RE | Admit: 2024-10-16 | Discharge: 2024-10-16 | Disposition: A | Discharge status: Home / Self Care | Source: Ambulatory Visit | Attending: Obstetrics and Gynecology | Admitting: Obstetrics and Gynecology

## 2024-10-16 DIAGNOSIS — Z1231 Encounter for screening mammogram for malignant neoplasm of breast: Secondary | ICD-10-CM | POA: Insufficient documentation
